# Patient Record
Sex: Female | Born: 1965 | Race: White | Hispanic: No | Smoking: Former smoker
Health system: Southern US, Community
[De-identification: ages and names within clinical notes are randomized; demographics above are authoritative.]

## PROBLEM LIST (undated history)

## (undated) DIAGNOSIS — I493 Ventricular premature depolarization: Secondary | ICD-10-CM

## (undated) DIAGNOSIS — O2230 Deep phlebothrombosis in pregnancy, unspecified trimester: Secondary | ICD-10-CM

## (undated) DIAGNOSIS — E782 Mixed hyperlipidemia: Secondary | ICD-10-CM

## (undated) DIAGNOSIS — E119 Type 2 diabetes mellitus without complications: Secondary | ICD-10-CM

## (undated) DIAGNOSIS — E559 Vitamin D deficiency, unspecified: Secondary | ICD-10-CM

## (undated) DIAGNOSIS — B369 Superficial mycosis, unspecified: Secondary | ICD-10-CM

## (undated) DIAGNOSIS — G43909 Migraine, unspecified, not intractable, without status migrainosus: Secondary | ICD-10-CM

## (undated) DIAGNOSIS — G4730 Sleep apnea, unspecified: Secondary | ICD-10-CM

## (undated) DIAGNOSIS — R0989 Other specified symptoms and signs involving the circulatory and respiratory systems: Secondary | ICD-10-CM

## (undated) DIAGNOSIS — E039 Hypothyroidism, unspecified: Secondary | ICD-10-CM

## (undated) DIAGNOSIS — F329 Major depressive disorder, single episode, unspecified: Secondary | ICD-10-CM

## (undated) DIAGNOSIS — I10 Essential (primary) hypertension: Secondary | ICD-10-CM

## (undated) DIAGNOSIS — M1990 Unspecified osteoarthritis, unspecified site: Secondary | ICD-10-CM

## (undated) DIAGNOSIS — M3320 Polymyositis, organ involvement unspecified: Secondary | ICD-10-CM

## (undated) DIAGNOSIS — G7249 Other inflammatory and immune myopathies, not elsewhere classified: Secondary | ICD-10-CM

## (undated) DIAGNOSIS — M48061 Spinal stenosis, lumbar region without neurogenic claudication: Secondary | ICD-10-CM

## (undated) DIAGNOSIS — G459 Transient cerebral ischemic attack, unspecified: Secondary | ICD-10-CM

## (undated) HISTORY — PX: BREAST REDUCTION SURGERY: SHX8

## (undated) HISTORY — PX: CHOLECYSTECTOMY: SHX55

## (undated) HISTORY — DX: Unspecified osteoarthritis, unspecified site: M19.90

## (undated) HISTORY — DX: Sleep apnea, unspecified: G47.30

## (undated) HISTORY — PX: BILATERAL OOPHORECTOMY: SHX1221

## (undated) HISTORY — DX: Migraine, unspecified, not intractable, without status migrainosus: G43.909

## (undated) HISTORY — DX: Polymyositis, organ involvement unspecified: M33.20

## (undated) HISTORY — DX: Other inflammatory and immune myopathies, not elsewhere classified: G72.49

## (undated) HISTORY — PX: ABDOMINAL HYSTERECTOMY: SHX81

## (undated) HISTORY — DX: Superficial mycosis, unspecified: B36.9

## (undated) HISTORY — DX: Other specified symptoms and signs involving the circulatory and respiratory systems: R09.89

## (undated) HISTORY — PX: TONSILLECTOMY: SUR1361

## (undated) HISTORY — PX: LEFT HEART CATH: CATH118248

## (undated) HISTORY — DX: Transient cerebral ischemic attack, unspecified: G45.9

## (undated) HISTORY — DX: Mixed hyperlipidemia: E78.2

## (undated) HISTORY — DX: Ventricular premature depolarization: I49.3

## (undated) HISTORY — DX: Hypothyroidism, unspecified: E03.9

## (undated) HISTORY — DX: Vitamin D deficiency, unspecified: E55.9

## (undated) HISTORY — DX: Major depressive disorder, single episode, unspecified: F32.9

## (undated) HISTORY — DX: Deep phlebothrombosis in pregnancy, unspecified trimester: O22.30

## (undated) HISTORY — DX: Type 2 diabetes mellitus without complications: E11.9

---

## 2011-03-25 HISTORY — PX: GASTRECTOMY: SHX58

## 2011-06-24 ENCOUNTER — Other Ambulatory Visit: Payer: Self-pay | Admitting: *Deleted

## 2011-06-24 ENCOUNTER — Other Ambulatory Visit: Payer: Self-pay | Admitting: Internal Medicine

## 2011-06-24 DIAGNOSIS — N63 Unspecified lump in unspecified breast: Secondary | ICD-10-CM

## 2011-06-27 ENCOUNTER — Ambulatory Visit
Admission: RE | Admit: 2011-06-27 | Discharge: 2011-06-27 | Disposition: A | Discharge status: Home / Self Care | Payer: BC Managed Care – PPO | Source: Ambulatory Visit | Attending: *Deleted | Admitting: *Deleted

## 2011-06-27 DIAGNOSIS — N63 Unspecified lump in unspecified breast: Secondary | ICD-10-CM

## 2011-07-01 ENCOUNTER — Other Ambulatory Visit: Payer: Self-pay | Admitting: *Deleted

## 2011-07-01 ENCOUNTER — Other Ambulatory Visit: Payer: Self-pay | Admitting: Licensed Clinical Social Worker

## 2011-07-01 DIAGNOSIS — N63 Unspecified lump in unspecified breast: Secondary | ICD-10-CM

## 2011-07-02 ENCOUNTER — Ambulatory Visit
Admission: RE | Admit: 2011-07-02 | Discharge: 2011-07-02 | Disposition: A | Discharge status: Home / Self Care | Payer: BC Managed Care – PPO | Source: Ambulatory Visit | Attending: *Deleted | Admitting: *Deleted

## 2011-07-02 DIAGNOSIS — N63 Unspecified lump in unspecified breast: Secondary | ICD-10-CM

## 2011-12-29 ENCOUNTER — Other Ambulatory Visit: Payer: Self-pay | Admitting: Internal Medicine

## 2011-12-29 DIAGNOSIS — Z1231 Encounter for screening mammogram for malignant neoplasm of breast: Secondary | ICD-10-CM

## 2012-01-20 ENCOUNTER — Ambulatory Visit
Admission: RE | Admit: 2012-01-20 | Discharge: 2012-01-20 | Disposition: A | Discharge status: Home / Self Care | Payer: BC Managed Care – PPO | Source: Ambulatory Visit | Attending: Internal Medicine | Admitting: Internal Medicine

## 2012-01-20 DIAGNOSIS — Z1231 Encounter for screening mammogram for malignant neoplasm of breast: Secondary | ICD-10-CM

## 2012-04-02 ENCOUNTER — Encounter (HOSPITAL_COMMUNITY): Admission: RE | Payer: Self-pay | Source: Ambulatory Visit

## 2012-04-02 ENCOUNTER — Ambulatory Visit (HOSPITAL_COMMUNITY)
Admission: RE | Admit: 2012-04-02 | Payer: BC Managed Care – PPO | Source: Ambulatory Visit | Admitting: Cardiovascular Disease

## 2012-04-02 SURGERY — ECHOCARDIOGRAM, TRANSESOPHAGEAL
Anesthesia: Moderate Sedation

## 2013-03-15 ENCOUNTER — Other Ambulatory Visit: Payer: Self-pay

## 2013-03-15 DIAGNOSIS — Z1231 Encounter for screening mammogram for malignant neoplasm of breast: Secondary | ICD-10-CM

## 2013-03-25 ENCOUNTER — Ambulatory Visit
Admission: RE | Admit: 2013-03-25 | Discharge: 2013-03-25 | Disposition: A | Discharge status: Home / Self Care | Payer: 59 | Source: Ambulatory Visit

## 2013-03-25 DIAGNOSIS — Z1231 Encounter for screening mammogram for malignant neoplasm of breast: Secondary | ICD-10-CM

## 2014-01-17 ENCOUNTER — Other Ambulatory Visit: Payer: Self-pay | Admitting: Internal Medicine

## 2014-01-17 DIAGNOSIS — N6310 Unspecified lump in the right breast, unspecified quadrant: Secondary | ICD-10-CM

## 2014-01-26 ENCOUNTER — Other Ambulatory Visit: Payer: 59

## 2014-01-27 ENCOUNTER — Other Ambulatory Visit: Payer: 59

## 2016-02-01 ENCOUNTER — Other Ambulatory Visit: Payer: Self-pay | Admitting: Internal Medicine

## 2016-02-04 ENCOUNTER — Other Ambulatory Visit: Payer: Self-pay | Admitting: Internal Medicine

## 2016-02-04 DIAGNOSIS — G459 Transient cerebral ischemic attack, unspecified: Secondary | ICD-10-CM

## 2016-02-06 ENCOUNTER — Other Ambulatory Visit: Payer: Self-pay | Admitting: Internal Medicine

## 2016-02-06 DIAGNOSIS — J849 Interstitial pulmonary disease, unspecified: Secondary | ICD-10-CM

## 2016-02-07 ENCOUNTER — Ambulatory Visit
Admission: RE | Admit: 2016-02-07 | Discharge: 2016-02-07 | Disposition: A | Discharge status: Home / Self Care | Payer: BLUE CROSS/BLUE SHIELD | Source: Ambulatory Visit | Attending: Internal Medicine | Admitting: Internal Medicine

## 2016-02-07 DIAGNOSIS — J849 Interstitial pulmonary disease, unspecified: Secondary | ICD-10-CM

## 2016-02-13 ENCOUNTER — Other Ambulatory Visit (HOSPITAL_COMMUNITY): Payer: Self-pay | Admitting: Internal Medicine

## 2016-02-13 DIAGNOSIS — R162 Hepatomegaly with splenomegaly, not elsewhere classified: Secondary | ICD-10-CM

## 2016-10-30 ENCOUNTER — Other Ambulatory Visit: Payer: Self-pay | Admitting: Internal Medicine

## 2016-10-30 DIAGNOSIS — R1084 Generalized abdominal pain: Secondary | ICD-10-CM

## 2016-11-04 ENCOUNTER — Ambulatory Visit
Admission: RE | Admit: 2016-11-04 | Discharge: 2016-11-04 | Disposition: A | Discharge status: Home / Self Care | Payer: BLUE CROSS/BLUE SHIELD | Source: Ambulatory Visit | Attending: Internal Medicine | Admitting: Internal Medicine

## 2016-11-04 DIAGNOSIS — R1084 Generalized abdominal pain: Secondary | ICD-10-CM

## 2016-11-04 MED ORDER — IOPAMIDOL (ISOVUE-300) INJECTION 61%
125.0000 mL | Freq: Once | INTRAVENOUS | Status: AC | PRN
  Administered 2016-11-04: 125 mL via INTRAVENOUS

## 2017-02-02 ENCOUNTER — Encounter: Payer: Self-pay | Admitting: Cardiology

## 2017-04-12 ENCOUNTER — Encounter: Payer: Self-pay | Admitting: Cardiology

## 2017-05-04 ENCOUNTER — Other Ambulatory Visit: Payer: Self-pay | Admitting: Internal Medicine

## 2017-05-04 DIAGNOSIS — J849 Interstitial pulmonary disease, unspecified: Secondary | ICD-10-CM

## 2017-05-13 ENCOUNTER — Ambulatory Visit
Admission: RE | Admit: 2017-05-13 | Discharge: 2017-05-13 | Disposition: A | Discharge status: Home / Self Care | Payer: BLUE CROSS/BLUE SHIELD | Source: Ambulatory Visit | Attending: Internal Medicine | Admitting: Internal Medicine

## 2017-05-13 DIAGNOSIS — J849 Interstitial pulmonary disease, unspecified: Secondary | ICD-10-CM

## 2017-05-18 ENCOUNTER — Other Ambulatory Visit: Payer: BLUE CROSS/BLUE SHIELD

## 2017-08-20 ENCOUNTER — Other Ambulatory Visit: Payer: Self-pay | Admitting: Internal Medicine

## 2017-08-20 DIAGNOSIS — S32810S Multiple fractures of pelvis with stable disruption of pelvic ring, sequela: Secondary | ICD-10-CM

## 2017-08-28 ENCOUNTER — Ambulatory Visit
Admission: RE | Admit: 2017-08-28 | Discharge: 2017-08-28 | Disposition: A | Discharge status: Home / Self Care | Payer: Managed Care, Other (non HMO) | Source: Ambulatory Visit | Attending: Internal Medicine | Admitting: Internal Medicine

## 2017-08-28 DIAGNOSIS — S32810S Multiple fractures of pelvis with stable disruption of pelvic ring, sequela: Secondary | ICD-10-CM

## 2017-10-01 ENCOUNTER — Encounter: Payer: Self-pay | Admitting: Cardiology

## 2018-04-04 ENCOUNTER — Encounter: Payer: Self-pay | Admitting: Cardiology

## 2018-04-26 ENCOUNTER — Encounter: Payer: Self-pay | Admitting: Cardiology

## 2018-04-26 NOTE — Progress Notes (Signed)
Cardiology Office Note   Date:  04/28/2018   ID:  Elizabeth Higgins, DOB 12-21-1965, MRN [id number]  PCP:  Doreen Salvage, PA-C  Cardiologist:   No primary care provider on file. Referring:  Coralee Rud, PA-C   Chief Complaint  Patient presents with  . Shortness of Breath  . Chest Pain     History of Present Illness: Elizabeth Higgins is a 53 y.o. female who is referred by Coralee Rud, PA-C  for evaluation of chest pain.  She was evaluated at [facility] for this.   She has a somewhat complicated past medical history with myositis managed at [facility].  This is left her with significant weakness.  She gets around with a walker.  She did have an abnormal stress test with chest pain in 2012.  She reports a cardiac catheterization that I do not have but she recalls having a 30% right coronary artery lesion.  She was having chest pain last year.  She had a stress perfusion study which did not suggest ischemia.  However, she had chest discomfort.  She had chest discomfort in January that was very severe.  She describes a midsternal discomfort.  There is radiation to her jaw or to her arms.  Going through to her back.  She had diaphoresis.  She had nausea.  She did have sublingual nitroglycerin and did not have improvement.  She went to [facility] where she was hospitalized overnight.  She ruled out for myocardial infarction.  However, because of her risk factors and symptoms the plan was for cardiac CT.  Unfortunately they could not get her heart rate down despite very high doses of beta-blockers.  She is referred now for consideration of cardiac catheterization by her cardiology practice.  She prefers to have this done at our institution rather than [place] will wait for his hospital.  She continues to have chest discomfort intermittently.  Typically is going away with one nitroglycerin.  She is on Imdur.  She is also had some tachypalpitations.  She is recorded heart rates in the 160s on her pulse  ox.  She is not describing presyncope or syncope however.     Past Medical History:  Diagnosis Date  . Autoimmune myopathy   . Bruit   . Dermatomycosis   . Diabetes mellitus type 2, controlled (HCC)   . DVT (deep vein thrombosis) in pregnancy   . Hyperlipemia, mixed   . Hypothyroidism   . Intermittent cerebral ischemia   . Migraines    WITH AURA  . Osteoarthritis   . Polymyositis (HCC)   . PVC (premature ventricular contraction)   . Reactive depression (situational)   . Sleep apnea    CPAP with 02  . Vitamin D deficiency     Past Surgical History:  Procedure Laterality Date  . ABDOMINAL HYSTERECTOMY    . BILATERAL OOPHORECTOMY    . BREAST REDUCTION SURGERY Bilateral   . CESAREAN SECTION    . CHOLECYSTECTOMY    . GASTRECTOMY  2013   SLEEVE  . LEFT HEART CATH    . TONSILLECTOMY       Current Outpatient Medications  Medication Sig Dispense Refill  . albuterol (PROVENTIL HFA;VENTOLIN HFA) 108 (90 Base) MCG/ACT inhaler Inhale 2 puffs into the lungs every 4 (four) hours as needed for wheezing or shortness of breath.    Marland Kitchen albuterol (PROVENTIL) (2.5 MG/3ML) 0.083% nebulizer solution Take 2.5 mg by nebulization every 6 (six) hours as needed for wheezing  or shortness of breath.    . allopurinol (ZYLOPRIM) 100 MG tablet Take 100 mg by mouth daily.    . baclofen (LIORESAL) 10 MG tablet Take 10 mg by mouth 4 (four) times daily as needed for muscle spasms.    . Cetirizine HCl (ZYRTEC PO) Take by mouth daily.    . clopidogrel (PLAVIX) 75 MG tablet Take 75 mg by mouth daily.    Marland Kitchen diltiazem (TIAZAC) 240 MG 24 hr capsule Take 240 mg by mouth daily.    Marland Kitchen dronabinol (MARINOL) 2.5 MG capsule Take 2.5 mg by mouth 2 (two) times daily before a meal.    . ergocalciferol (VITAMIN D2) 1.25 MG (50000 UT) capsule Take 50,000 Units by mouth 2 (two) times a week.    . fluconazole (DIFLUCAN) 200 MG tablet Take 200 mg by mouth daily.    . Fluticasone-Umeclidin-Vilant (TRELEGY ELLIPTA) 100-62.5-25  MCG/INH AEPB Inhale into the lungs.    . gabapentin (NEURONTIN) 300 MG capsule Take 300 mg by mouth 4 (four) times daily.    . hydrocortisone 2.5 % cream Apply 1 application topically 2 (two) times daily.    . insulin aspart (NOVOLOG) 100 UNIT/ML injection Inject into the skin 3 (three) times daily before meals.    . isosorbide mononitrate (IMDUR) 30 MG 24 hr tablet Take 30 mg by mouth daily.    Marland Kitchen levothyroxine (SYNTHROID, LEVOTHROID) 125 MCG tablet Take 125 mcg by mouth daily before breakfast.    . LORazepam (ATIVAN) 1 MG tablet Take 1 mg by mouth every 4 (four) hours as needed for anxiety.    Marland Kitchen losartan (COZAAR) 50 MG tablet Take 50 mg by mouth daily.    . magnesium gluconate (MAGONATE) 500 MG tablet Take 500 mg by mouth daily.    . metoCLOPramide (REGLAN) 10 MG tablet Take 10 mg by mouth 2 (two) times daily.    . metolazone (ZAROXOLYN) 5 MG tablet Take 5 mg by mouth daily.    . montelukast (SINGULAIR) 10 MG tablet Take 10 mg by mouth at bedtime.    . mycophenolate (CELLCEPT) 500 MG tablet Take 500 mg by mouth 2 (two) times daily.    . nitroGLYCERIN (NITROSTAT) 0.4 MG SL tablet Place 0.4 mg under the tongue every 5 (five) minutes as needed for chest pain.    Marland Kitchen nystatin (MYCOSTATIN) 100000 UNIT/ML suspension Take 5 mLs by mouth 4 (four) times daily.    Marland Kitchen nystatin (NYSTATIN) powder Apply topically 2 (two) times daily.    . ondansetron (ZOFRAN) 8 MG tablet Take 8 mg by mouth 2 (two) times daily.    Marland Kitchen oseltamivir (TAMIFLU) 75 MG capsule Take 75 mg by mouth daily.    Marland Kitchen oxyCODONE (OXY IR/ROXICODONE) 5 MG immediate release tablet Take 5 mg by mouth every 4 (four) hours as needed for severe pain.    . pantoprazole (PROTONIX) 40 MG tablet Take 40 mg by mouth daily.    . Pitavastatin Calcium (LIVALO) 2 MG TABS Take 1 tablet by mouth 2 (two) times a week.    . potassium chloride (K-DUR,KLOR-CON) 10 MEQ tablet Take 10 mEq by mouth 5 (five) times daily.    . predniSONE (DELTASONE) 10 MG tablet Take 10 mg  by mouth daily with breakfast.    . promethazine (PHENERGAN) 12.5 MG tablet Take 12.5 mg by mouth every 6 (six) hours as needed for nausea or vomiting.    . sucralfate (CARAFATE) 1 g tablet Take 1 g by mouth 4 (four) times daily -  with meals and at bedtime.     No current facility-administered medications for this visit.     Allergies:   Cymbalta [duloxetine hcl]; Demerol [meperidine hcl]; Penicillins; and Rocephin [ceftriaxone]    Social History:  The patient  reports that she quit smoking about 11 years ago. She has never used smokeless tobacco. She reports that she does not drink alcohol or use drugs.   Family History:  The patient's family history includes Diabetes Mellitus II in her mother and sister; Heart disease in her father and mother; Hyperlipidemia in her father and mother; Hypertension in her father and mother.    ROS:  Please see the history of present illness.   Otherwise, review of systems are positive for joint pain, diffuse weakness.   All other systems are reviewed and negative.    PHYSICAL EXAM: VS:  BP 128/84 (BP Location: Left Wrist, Patient Position: Sitting, Cuff Size: Normal)   Pulse 92   Ht 5\' 3"  (1.6 m)   Wt 285 lb (129.3 kg)   BMI 50.49 kg/m  , BMI Body mass index is 50.49 kg/m. GENERAL:  Well appearing HEENT:  Pupils equal round and reactive, fundi not visualized, oral mucosa unremarkable NECK:  No jugular venous distention, waveform within normal limits, carotid upstroke brisk and symmetric, no bruits, no thyromegaly LYMPHATICS:  No cervical, inguinal adenopathy LUNGS:  Clear to auscultation bilaterally BACK:  No CVA tenderness CHEST:  Unremarkable HEART:  PMI not displaced or sustained,S1 and S2 within normal limits, no S3, no S4, no clicks, no rubs, no murmurs ABD:  Flat, positive bowel sounds normal in frequency in pitch, no bruits, no rebound, no guarding, no midline pulsatile mass, no hepatomegaly, no splenomegaly EXT:  2 plus pulses throughout,  no edema, no cyanosis no clubbing SKIN:  No rashes no nodules NEURO:  Cranial nerves II through XII grossly intact, motor grossly intact throughout PSYCH:  Cognitively intact, oriented to person place and time    EKG:  EKG is ordered today. The ekg ordered today demonstrates sinus rhythm, rate 92, axis within normal limits, intervals within normal limits, possible left atrial enlargement, premature atrial contraction, nonspecific T wave flattening, low voltage in limb and chest leads.   Recent Labs: No results found for requested labs within last 8760 hours.    Lipid Panel No results found for: CHOL, TRIG, HDL, CHOLHDL, VLDL, LDLCALC, LDLDIRECT    Wt Readings from Last 3 Encounters:  04/27/18 285 lb (129.3 kg)      Other studies Reviewed: Additional studies/ records that were reviewed today include: Extensive office records. Review of the above records demonstrates:  Please see elsewhere in the note.     ASSESSMENT AND PLAN:   CHEST PAIN:   The patient's chest pain is consistent with unstable angina.  She was unable to have a cardiac CT.  Cardiac catheterization is indicated. The patient understands that risks included but are not limited to stroke (1 in 1000), death (1 in 1000), kidney failure [usually temporary] (1 in 500), bleeding (1 in 200), allergic reaction [possibly serious] (1 in 200).  The patient understands and agrees to proceed.   TACHYCARDIA: For now she will continue the meds as listed.  Labs have been drawn to include a TSH.  I will defer change in therapy at this point.  DYSPNEA: Right heart catheterization has been requested as well.  We will obtain this.   Current medicines are reviewed at length with the patient today.  The patient does not  have concerns regarding medicines.  The following changes have been made:  no change  Labs/ tests ordered today include:   Orders Placed This Encounter  Procedures  . EKG 12-Lead     Disposition:   FU with  Arnette Felts, PAc    Signed, Rollene Rotunda, MD  04/28/2018 9:51 AM    [facility]

## 2018-04-26 NOTE — H&P (View-Only) (Signed)
  Cardiology Office Note   Date:  04/28/2018   ID:  Elizabeth Higgins, DOB 10/07/1965, MRN [id number]  PCP:  Bulla, Donald, PA-C  Cardiologist:   No primary care provider on file. Referring:  Duran, Michael R, PA-C   Chief Complaint  Patient presents with  . Shortness of Breath  . Chest Pain     History of Present Illness: Elizabeth Higgins is a 53 y.o. female who is referred by Duran, Michael R, PA-C  for evaluation of chest pain.  She was evaluated at [facility] for this.   She has a somewhat complicated past medical history with myositis managed at [facility].  This is left her with significant weakness.  She gets around with a walker.  She did have an abnormal stress test with chest pain in 2012.  She reports a cardiac catheterization that I do not have but she recalls having a 30% right coronary artery lesion.  She was having chest pain last year.  She had a stress perfusion study which did not suggest ischemia.  However, she had chest discomfort.  She had chest discomfort in January that was very severe.  She describes a midsternal discomfort.  There is radiation to her jaw or to her arms.  Going through to her back.  She had diaphoresis.  She had nausea.  She did have sublingual nitroglycerin and did not have improvement.  She went to [place] where she was hospitalized overnight.  She ruled out for myocardial infarction.  However, because of her risk factors and symptoms the plan was for cardiac CT.  Unfortunately they could not get her heart rate down despite very high doses of beta-blockers.  She is referred now for consideration of cardiac catheterization by her cardiology practice.  She prefers to have this done at our institution rather than [place] will wait for his hospital.  She continues to have chest discomfort intermittently.  Typically is going away with one nitroglycerin.  She is on Imdur.  She is also had some tachypalpitations.  She is recorded heart rates in the 160s on her pulse  ox.  She is not describing presyncope or syncope however.     Past Medical History:  Diagnosis Date  . Autoimmune myopathy   . Bruit   . Dermatomycosis   . Diabetes mellitus type 2, controlled (HCC)   . DVT (deep vein thrombosis) in pregnancy   . Hyperlipemia, mixed   . Hypothyroidism   . Intermittent cerebral ischemia   . Migraines    WITH AURA  . Osteoarthritis   . Polymyositis (HCC)   . PVC (premature ventricular contraction)   . Reactive depression (situational)   . Sleep apnea    CPAP with 02  . Vitamin D deficiency     Past Surgical History:  Procedure Laterality Date  . ABDOMINAL HYSTERECTOMY    . BILATERAL OOPHORECTOMY    . BREAST REDUCTION SURGERY Bilateral   . CESAREAN SECTION    . CHOLECYSTECTOMY    . GASTRECTOMY  2013   SLEEVE  . LEFT HEART CATH    . TONSILLECTOMY       Current Outpatient Medications  Medication Sig Dispense Refill  . albuterol (PROVENTIL HFA;VENTOLIN HFA) 108 (90 Base) MCG/ACT inhaler Inhale 2 puffs into the lungs every 4 (four) hours as needed for wheezing or shortness of breath.    . albuterol (PROVENTIL) (2.5 MG/3ML) 0.083% nebulizer solution Take 2.5 mg by nebulization every 6 (six) hours as needed for wheezing   or shortness of breath.    . allopurinol (ZYLOPRIM) 100 MG tablet Take 100 mg by mouth daily.    . baclofen (LIORESAL) 10 MG tablet Take 10 mg by mouth 4 (four) times daily as needed for muscle spasms.    . Cetirizine HCl (ZYRTEC PO) Take by mouth daily.    . clopidogrel (PLAVIX) 75 MG tablet Take 75 mg by mouth daily.    . diltiazem (TIAZAC) 240 MG 24 hr capsule Take 240 mg by mouth daily.    . dronabinol (MARINOL) 2.5 MG capsule Take 2.5 mg by mouth 2 (two) times daily before a meal.    . ergocalciferol (VITAMIN D2) 1.25 MG (50000 UT) capsule Take 50,000 Units by mouth 2 (two) times a week.    . fluconazole (DIFLUCAN) 200 MG tablet Take 200 mg by mouth daily.    . Fluticasone-Umeclidin-Vilant (TRELEGY ELLIPTA) 100-62.5-25  MCG/INH AEPB Inhale into the lungs.    . gabapentin (NEURONTIN) 300 MG capsule Take 300 mg by mouth 4 (four) times daily.    . hydrocortisone 2.5 % cream Apply 1 application topically 2 (two) times daily.    . insulin aspart (NOVOLOG) 100 UNIT/ML injection Inject into the skin 3 (three) times daily before meals.    . isosorbide mononitrate (IMDUR) 30 MG 24 hr tablet Take 30 mg by mouth daily.    . levothyroxine (SYNTHROID, LEVOTHROID) 125 MCG tablet Take 125 mcg by mouth daily before breakfast.    . LORazepam (ATIVAN) 1 MG tablet Take 1 mg by mouth every 4 (four) hours as needed for anxiety.    . losartan (COZAAR) 50 MG tablet Take 50 mg by mouth daily.    . magnesium gluconate (MAGONATE) 500 MG tablet Take 500 mg by mouth daily.    . metoCLOPramide (REGLAN) 10 MG tablet Take 10 mg by mouth 2 (two) times daily.    . metolazone (ZAROXOLYN) 5 MG tablet Take 5 mg by mouth daily.    . montelukast (SINGULAIR) 10 MG tablet Take 10 mg by mouth at bedtime.    . mycophenolate (CELLCEPT) 500 MG tablet Take 500 mg by mouth 2 (two) times daily.    . nitroGLYCERIN (NITROSTAT) 0.4 MG SL tablet Place 0.4 mg under the tongue every 5 (five) minutes as needed for chest pain.    . nystatin (MYCOSTATIN) 100000 UNIT/ML suspension Take 5 mLs by mouth 4 (four) times daily.    . nystatin (NYSTATIN) powder Apply topically 2 (two) times daily.    . ondansetron (ZOFRAN) 8 MG tablet Take 8 mg by mouth 2 (two) times daily.    . oseltamivir (TAMIFLU) 75 MG capsule Take 75 mg by mouth daily.    . oxyCODONE (OXY IR/ROXICODONE) 5 MG immediate release tablet Take 5 mg by mouth every 4 (four) hours as needed for severe pain.    . pantoprazole (PROTONIX) 40 MG tablet Take 40 mg by mouth daily.    . Pitavastatin Calcium (LIVALO) 2 MG TABS Take 1 tablet by mouth 2 (two) times a week.    . potassium chloride (K-DUR,KLOR-CON) 10 MEQ tablet Take 10 mEq by mouth 5 (five) times daily.    . predniSONE (DELTASONE) 10 MG tablet Take 10 mg  by mouth daily with breakfast.    . promethazine (PHENERGAN) 12.5 MG tablet Take 12.5 mg by mouth every 6 (six) hours as needed for nausea or vomiting.    . sucralfate (CARAFATE) 1 g tablet Take 1 g by mouth 4 (four) times daily -    with meals and at bedtime.     No current facility-administered medications for this visit.     Allergies:   Cymbalta [duloxetine hcl]; Demerol [meperidine hcl]; Penicillins; and Rocephin [ceftriaxone]    Social History:  The patient  reports that she quit smoking about 11 years ago. She has never used smokeless tobacco. She reports that she does not drink alcohol or use drugs.   Family History:  The patient's family history includes Diabetes Mellitus II in her mother and sister; Heart disease in her father and mother; Hyperlipidemia in her father and mother; Hypertension in her father and mother.    ROS:  Please see the history of present illness.   Otherwise, review of systems are positive for joint pain, diffuse weakness.   All other systems are reviewed and negative.    PHYSICAL EXAM: VS:  BP 128/84 (BP Location: Left Wrist, Patient Position: Sitting, Cuff Size: Normal)   Pulse 92   Ht 5' 3" (1.6 m)   Wt 285 lb (129.3 kg)   BMI 50.49 kg/m  , BMI Body mass index is 50.49 kg/m. GENERAL:  Well appearing HEENT:  Pupils equal round and reactive, fundi not visualized, oral mucosa unremarkable NECK:  No jugular venous distention, waveform within normal limits, carotid upstroke brisk and symmetric, no bruits, no thyromegaly LYMPHATICS:  No cervical, inguinal adenopathy LUNGS:  Clear to auscultation bilaterally BACK:  No CVA tenderness CHEST:  Unremarkable HEART:  PMI not displaced or sustained,S1 and S2 within normal limits, no S3, no S4, no clicks, no rubs, no murmurs ABD:  Flat, positive bowel sounds normal in frequency in pitch, no bruits, no rebound, no guarding, no midline pulsatile mass, no hepatomegaly, no splenomegaly EXT:  2 plus pulses throughout,  no edema, no cyanosis no clubbing SKIN:  No rashes no nodules NEURO:  Cranial nerves II through XII grossly intact, motor grossly intact throughout PSYCH:  Cognitively intact, oriented to person place and time    EKG:  EKG is ordered today. The ekg ordered today demonstrates sinus rhythm, rate 92, axis within normal limits, intervals within normal limits, possible left atrial enlargement, premature atrial contraction, nonspecific T wave flattening, low voltage in limb and chest leads.   Recent Labs: No results found for requested labs within last 8760 hours.    Lipid Panel No results found for: CHOL, TRIG, HDL, CHOLHDL, VLDL, LDLCALC, LDLDIRECT    Wt Readings from Last 3 Encounters:  04/27/18 285 lb (129.3 kg)      Other studies Reviewed: Additional studies/ records that were reviewed today include: Extensive office records. Review of the above records demonstrates:  Please see elsewhere in the note.     ASSESSMENT AND PLAN:   CHEST PAIN:   The patient's chest pain is consistent with unstable angina.  She was unable to have a cardiac CT.  Cardiac catheterization is indicated. The patient understands that risks included but are not limited to stroke (1 in 1000), death (1 in 1000), kidney failure [usually temporary] (1 in 500), bleeding (1 in 200), allergic reaction [possibly serious] (1 in 200).  The patient understands and agrees to proceed.   TACHYCARDIA: For now she will continue the meds as listed.  Labs have been drawn to include a TSH.  I will defer change in therapy at this point.  DYSPNEA: Right heart catheterization has been requested as well.  We will obtain this.   Current medicines are reviewed at length with the patient today.  The patient does not   have concerns regarding medicines.  The following changes have been made:  no change  Labs/ tests ordered today include:   Orders Placed This Encounter  Procedures  . EKG 12-Lead     Disposition:   FU with  Mike Duran, PAc    Signed, Jeimy Bickert, MD  04/28/2018 9:51 AM    [facility]    

## 2018-04-27 ENCOUNTER — Encounter: Payer: Self-pay | Admitting: Cardiology

## 2018-04-27 ENCOUNTER — Encounter (INDEPENDENT_AMBULATORY_CARE_PROVIDER_SITE_OTHER): Payer: Self-pay

## 2018-04-27 ENCOUNTER — Ambulatory Visit: Payer: BLUE CROSS/BLUE SHIELD | Admitting: Cardiology

## 2018-04-27 VITALS — BP 128/84 | HR 92 | Ht 63.0 in | Wt 285.0 lb

## 2018-04-27 DIAGNOSIS — R0602 Shortness of breath: Secondary | ICD-10-CM | POA: Diagnosis not present

## 2018-04-27 DIAGNOSIS — R072 Precordial pain: Secondary | ICD-10-CM | POA: Diagnosis not present

## 2018-04-27 DIAGNOSIS — R000 Tachycardia, unspecified: Secondary | ICD-10-CM

## 2018-04-27 NOTE — Patient Instructions (Addendum)
Medication Instructions:  Continue current medications  If you need a refill on your cardiac medications before your next appointment, please call your pharmacy.  Labwork: None Ordered  Take the provided lab slips with you to the lab for your blood draw.   When you have your labs (blood work) drawn today and your tests are completely normal, you will receive your results only by MyChart Message (if you have MyChart) -OR-  A paper copy in the mail.  If you have any lab test that is abnormal or we need to change your treatment, we will call you to review these results.  Testing/Procedures: Your physician has requested that you have a cardiac catheterization. Cardiac catheterization is used to diagnose and/or treat various heart conditions. Doctors may recommend this procedure for a number of different reasons. The most common reason is to evaluate chest pain. Chest pain can be a symptom of coronary artery disease (CAD), and cardiac catheterization can show whether plaque is narrowing or blocking your heart's arteries. This procedure is also used to evaluate the valves, as well as measure the blood flow and oxygen levels in different parts of your heart. For further information please visit [email or web address] Please follow instruction sheet, as given.  Special Instructions:    [facility] [address] [place] [address] Dept: [phone] Loc: [phone]  Elizabeth CoopJudith Higgins  04/27/2018  You are scheduled for a Cardiac Catheterization on Friday, February 7 with Dr. Bryan Lemmaavid Harding.  1. [facility] at [facility]: [address] [place], [place] [address] at 10:00 AM (This time is two hours before your procedure to ensure your preparation). Free valet parking service is available.   Special note: Every effort is made to have your procedure done on time. Please  understand that emergencies sometimes delay scheduled procedures.  2. Diet: Do not eat solid foods after midnight.  The patient may have clear liquids until 5am upon the day of the procedure.  3. Labs: None  4. Medication instructions in preparation for your procedure:  Stop taking, Cozaar (Losartan) Friday, February 6,  HOLD Novolog day of procedure   On the morning of your procedure, take your Plavix/Clopidogrel and any morning medicines NOT listed above.  You may use sips of water.  5. Plan for one night stay--bring personal belongings. 6. Bring a current list of your medications and current insurance cards. 7. You MUST have a responsible person to drive you home. 8. Someone MUST be with you the first 24 hours after you arrive home or your discharge will be delayed. 9. Please wear clothes that are easy to get on and off and wear slip-on shoes.  Thank you for allowing us to care for you!   -- [facility]   Follow-Up: . Your physician recommends that you schedule a follow-up appointment in: After Cath   At [facility], you and your health needs are our priority.  As part of our continuing mission to provide you with exceptional heart care, we have created designated Provider Care Teams.  These Care Teams include your primary Cardiologist (physician) and Advanced Practice Providers (APPs -  Physician Assistants and Nurse Practitioners) who all work together to provide you with the care you need, when you need it.  Thank you for choosing [facility] at [facility]!!

## 2018-04-28 ENCOUNTER — Telehealth: Payer: Self-pay | Admitting: Cardiology

## 2018-04-28 ENCOUNTER — Encounter: Payer: Self-pay | Admitting: Cardiology

## 2018-04-28 ENCOUNTER — Encounter (HOSPITAL_COMMUNITY): Payer: Self-pay | Admitting: Cardiology

## 2018-04-28 DIAGNOSIS — R072 Precordial pain: Secondary | ICD-10-CM | POA: Insufficient documentation

## 2018-04-28 DIAGNOSIS — R000 Tachycardia, unspecified: Secondary | ICD-10-CM | POA: Insufficient documentation

## 2018-04-28 DIAGNOSIS — R0602 Shortness of breath: Secondary | ICD-10-CM | POA: Insufficient documentation

## 2018-04-28 DIAGNOSIS — I25110 Atherosclerotic heart disease of native coronary artery with unstable angina pectoris: Secondary | ICD-10-CM | POA: Diagnosis present

## 2018-04-28 NOTE — Telephone Encounter (Signed)
New Message    Patient wants to make sure the Labs results have been received from primary care physician's office.

## 2018-04-28 NOTE — Telephone Encounter (Signed)
Left message to call back  

## 2018-04-28 NOTE — Telephone Encounter (Signed)
Spoke with pt who was calling to inquire if Dr. Herbie Baltimore received her lab work from her pcp. Informed pt I will send a message to Nurse and have her call her back. Pt states to only call her back if results have not been received.

## 2018-04-29 ENCOUNTER — Telehealth: Payer: Self-pay | Admitting: Cardiology

## 2018-04-29 ENCOUNTER — Telehealth: Payer: Self-pay | Admitting: *Deleted

## 2018-04-29 NOTE — Telephone Encounter (Signed)
New message    Pt stated that she was told that her lab were not received. Pt is getting procedure done tomorrow at hospital and when called to review instructions she was told did not received labs. Please follow up with pt .

## 2018-04-29 NOTE — Telephone Encounter (Signed)
Pt contacted by our procedure nurse Emelia Loron. Her pre-cath lab results have been received.

## 2018-04-29 NOTE — Telephone Encounter (Signed)
Pt is aware that we have  February 3 lab results from [facility] and they are in [place].  Pt is aware that IV consult has been placed and she will been seen in Short Stay pre cath for peripheral IV access.

## 2018-04-29 NOTE — Telephone Encounter (Signed)
Pt contacted pre-catheterization scheduled at [facility] for: Friday April 30, 2018 12 noon Verified arrival time and place: [facility] Main Entrance A at: 10 AM  No solid food after midnight prior to cath, clear liquids until 5 AM day of procedure. Contrast allergy: no  Hold: Insulin-AM of procedure. Losartan-day before and day of procedure-GFR 54.83 Zaroxlyn-day before and day of procedure. KCl-pt will adjust. NSAIDS-day before and day of procedure.  Except hold medications AM meds can be  taken pre-cath with sip of water including: ASA 81 mg Plavix 75 mg   Confirmed patient has responsible person to drive home post procedure and observe 24 hours after arriving home: yes

## 2018-04-30 ENCOUNTER — Observation Stay (HOSPITAL_COMMUNITY)
Admission: RE | Admit: 2018-04-30 | Discharge: 2018-05-01 | Disposition: A | Discharge status: Home / Self Care | Payer: BLUE CROSS/BLUE SHIELD | Attending: Cardiology | Admitting: Cardiology

## 2018-04-30 ENCOUNTER — Other Ambulatory Visit: Payer: Self-pay

## 2018-04-30 ENCOUNTER — Encounter (HOSPITAL_COMMUNITY)
Admission: RE | Disposition: A | Discharge status: Home / Self Care | Payer: Self-pay | Source: Home / Self Care | Attending: Cardiology

## 2018-04-30 ENCOUNTER — Encounter (HOSPITAL_COMMUNITY): Payer: Self-pay | Admitting: *Deleted

## 2018-04-30 DIAGNOSIS — E876 Hypokalemia: Secondary | ICD-10-CM | POA: Diagnosis not present

## 2018-04-30 DIAGNOSIS — Z885 Allergy status to narcotic agent status: Secondary | ICD-10-CM | POA: Insufficient documentation

## 2018-04-30 DIAGNOSIS — M3320 Polymyositis, organ involvement unspecified: Secondary | ICD-10-CM | POA: Diagnosis not present

## 2018-04-30 DIAGNOSIS — R0602 Shortness of breath: Secondary | ICD-10-CM | POA: Diagnosis present

## 2018-04-30 DIAGNOSIS — Z79899 Other long term (current) drug therapy: Secondary | ICD-10-CM | POA: Diagnosis not present

## 2018-04-30 DIAGNOSIS — I25110 Atherosclerotic heart disease of native coronary artery with unstable angina pectoris: Secondary | ICD-10-CM | POA: Diagnosis not present

## 2018-04-30 DIAGNOSIS — R072 Precordial pain: Secondary | ICD-10-CM | POA: Diagnosis present

## 2018-04-30 DIAGNOSIS — I200 Unstable angina: Secondary | ICD-10-CM

## 2018-04-30 DIAGNOSIS — I472 Ventricular tachycardia: Secondary | ICD-10-CM | POA: Insufficient documentation

## 2018-04-30 DIAGNOSIS — E119 Type 2 diabetes mellitus without complications: Secondary | ICD-10-CM | POA: Insufficient documentation

## 2018-04-30 DIAGNOSIS — Z880 Allergy status to penicillin: Secondary | ICD-10-CM | POA: Insufficient documentation

## 2018-04-30 DIAGNOSIS — I493 Ventricular premature depolarization: Secondary | ICD-10-CM | POA: Diagnosis not present

## 2018-04-30 DIAGNOSIS — Z881 Allergy status to other antibiotic agents status: Secondary | ICD-10-CM | POA: Diagnosis not present

## 2018-04-30 DIAGNOSIS — Z888 Allergy status to other drugs, medicaments and biological substances status: Secondary | ICD-10-CM | POA: Insufficient documentation

## 2018-04-30 DIAGNOSIS — I5033 Acute on chronic diastolic (congestive) heart failure: Secondary | ICD-10-CM | POA: Diagnosis present

## 2018-04-30 DIAGNOSIS — E039 Hypothyroidism, unspecified: Secondary | ICD-10-CM | POA: Insufficient documentation

## 2018-04-30 DIAGNOSIS — I471 Supraventricular tachycardia: Secondary | ICD-10-CM | POA: Insufficient documentation

## 2018-04-30 DIAGNOSIS — R000 Tachycardia, unspecified: Secondary | ICD-10-CM | POA: Diagnosis present

## 2018-04-30 DIAGNOSIS — G4730 Sleep apnea, unspecified: Secondary | ICD-10-CM | POA: Insufficient documentation

## 2018-04-30 HISTORY — DX: Essential (primary) hypertension: I10

## 2018-04-30 HISTORY — DX: Spinal stenosis, lumbar region without neurogenic claudication: M48.061

## 2018-04-30 HISTORY — PX: RIGHT/LEFT HEART CATH AND CORONARY ANGIOGRAPHY: CATH118266

## 2018-04-30 LAB — POCT I-STAT EG7
Acid-Base Excess: 9 mmol/L — ABNORMAL HIGH (ref 0.0–2.0)
Acid-Base Excess: 9 mmol/L — ABNORMAL HIGH (ref 0.0–2.0)
Acid-Base Excess: 9 mmol/L — ABNORMAL HIGH (ref 0.0–2.0)
Bicarbonate: 34.4 mmol/L — ABNORMAL HIGH (ref 20.0–28.0)
Bicarbonate: 34 mmol/L — ABNORMAL HIGH (ref 20.0–28.0)
Bicarbonate: 34.3 mmol/L — ABNORMAL HIGH (ref 20.0–28.0)
CALCIUM ION: 1.1 mmol/L — AB (ref 1.15–1.40)
Calcium, Ion: 1.13 mmol/L — ABNORMAL LOW (ref 1.15–1.40)
Calcium, Ion: 1.1 mmol/L — ABNORMAL LOW (ref 1.15–1.40)
HCT: 34 % — ABNORMAL LOW (ref 36.0–46.0)
HCT: 32 % — ABNORMAL LOW (ref 36.0–46.0)
HCT: 33 % — ABNORMAL LOW (ref 36.0–46.0)
HEMOGLOBIN: 11.2 g/dL — AB (ref 12.0–15.0)
Hemoglobin: 11.6 g/dL — ABNORMAL LOW (ref 12.0–15.0)
Hemoglobin: 10.9 g/dL — ABNORMAL LOW (ref 12.0–15.0)
O2 Saturation: 69 %
O2 Saturation: 66 %
O2 Saturation: 75 %
PCO2 VEN: 50 mmHg (ref 44.0–60.0)
POTASSIUM: 3.3 mmol/L — AB (ref 3.5–5.1)
Potassium: 3.4 mmol/L — ABNORMAL LOW (ref 3.5–5.1)
Potassium: 3.3 mmol/L — ABNORMAL LOW (ref 3.5–5.1)
Sodium: 139 mmol/L (ref 135–145)
Sodium: 138 mmol/L (ref 135–145)
Sodium: 138 mmol/L (ref 135–145)
TCO2: 36 mmol/L — ABNORMAL HIGH (ref 22–32)
TCO2: 35 mmol/L — ABNORMAL HIGH (ref 22–32)
TCO2: 36 mmol/L — ABNORMAL HIGH (ref 22–32)
pCO2, Ven: 48.1 mmHg (ref 44.0–60.0)
pCO2, Ven: 47.7 mmHg (ref 44.0–60.0)
pH, Ven: 7.462 — ABNORMAL HIGH (ref 7.250–7.430)
pH, Ven: 7.444 — ABNORMAL HIGH (ref 7.250–7.430)
pH, Ven: 7.461 — ABNORMAL HIGH (ref 7.250–7.430)
pO2, Ven: 35 mmHg (ref 32.0–45.0)
pO2, Ven: 33 mmHg (ref 32.0–45.0)
pO2, Ven: 39 mmHg (ref 32.0–45.0)

## 2018-04-30 LAB — POCT I-STAT 7, (LYTES, BLD GAS, ICA,H+H)
Acid-Base Excess: 9 mmol/L — ABNORMAL HIGH (ref 0.0–2.0)
Bicarbonate: 33 mmol/L — ABNORMAL HIGH (ref 20.0–28.0)
Calcium, Ion: 1.11 mmol/L — ABNORMAL LOW (ref 1.15–1.40)
HCT: 33 % — ABNORMAL LOW (ref 36.0–46.0)
Hemoglobin: 11.2 g/dL — ABNORMAL LOW (ref 12.0–15.0)
O2 Saturation: 97 %
POTASSIUM: 3.3 mmol/L — AB (ref 3.5–5.1)
Sodium: 138 mmol/L (ref 135–145)
TCO2: 34 mmol/L — ABNORMAL HIGH (ref 22–32)
pCO2 arterial: 44.4 mmHg (ref 32.0–48.0)
pH, Arterial: 7.48 — ABNORMAL HIGH (ref 7.350–7.450)
pO2, Arterial: 83 mmHg (ref 83.0–108.0)

## 2018-04-30 LAB — CREATININE, SERUM
Creatinine, Ser: 0.86 mg/dL (ref 0.44–1.00)
GFR calc Af Amer: >60 mL/min (ref >60)
GFR calc non Af Amer: >60 mL/min (ref >60)

## 2018-04-30 LAB — CBC
HCT: 42.9 % (ref 36.0–46.0)
Hemoglobin: 13.8 g/dL (ref 12.0–15.0)
MCH: 29.4 pg (ref 26.0–34.0)
MCHC: 32.2 g/dL (ref 30.0–36.0)
MCV: 91.5 fL (ref 80.0–100.0)
Platelets: 206 10*3/uL (ref 150–400)
RBC: 4.69 MIL/uL (ref 3.87–5.11)
RDW: 13.9 % (ref 11.5–15.5)
WBC: 10.7 10*3/uL — ABNORMAL HIGH (ref 4.0–10.5)
nRBC: 0.2 % (ref 0.0–0.2)

## 2018-04-30 LAB — POTASSIUM: Potassium: 3.5 mmol/L (ref 3.5–5.1)

## 2018-04-30 LAB — BASIC METABOLIC PANEL
Anion gap: 14 (ref 5–15)
BUN: 17 mg/dL (ref 6–20)
CO2: 28 mmol/L (ref 22–32)
Calcium: 8.7 mg/dL — ABNORMAL LOW (ref 8.9–10.3)
Chloride: 98 mmol/L (ref 98–111)
Creatinine, Ser: 0.85 mg/dL (ref 0.44–1.00)
GFR calc Af Amer: >60 mL/min (ref >60)
GFR calc non Af Amer: >60 mL/min (ref >60)
Glucose, Bld: 245 mg/dL — ABNORMAL HIGH (ref 70–99)
Potassium: 3.6 mmol/L (ref 3.5–5.1)
Sodium: 140 mmol/L (ref 135–145)

## 2018-04-30 LAB — GLUCOSE, CAPILLARY
Glucose-Capillary: 180 mg/dL — ABNORMAL HIGH (ref 70–99)
Glucose-Capillary: 139 mg/dL — ABNORMAL HIGH (ref 70–99)
Glucose-Capillary: 285 mg/dL — ABNORMAL HIGH (ref 70–99)

## 2018-04-30 SURGERY — RIGHT/LEFT HEART CATH AND CORONARY ANGIOGRAPHY
Anesthesia: LOCAL

## 2018-04-30 MED ORDER — SODIUM CHLORIDE 0.9% FLUSH: 3.0000 mL | Freq: Two times a day (BID) | INTRAVENOUS | Status: DC

## 2018-04-30 MED ORDER — SODIUM CHLORIDE 0.9% FLUSH
3.0000 mL | Freq: Two times a day (BID) | INTRAVENOUS | Status: DC
  Administered 2018-04-30: 3 mL via INTRAVENOUS

## 2018-04-30 MED ORDER — DRONABINOL 2.5 MG PO CAPS
2.5000 mg | ORAL_CAPSULE | Freq: Two times a day (BID) | ORAL | Status: DC
  Administered 2018-04-30 – 2018-05-01 (×2): 2.5 mg via ORAL
  Filled 2018-04-30 (×3): qty 1

## 2018-04-30 MED ORDER — DILTIAZEM HCL ER BEADS 240 MG PO CP24
360.0000 mg | ORAL_CAPSULE | Freq: Every day | ORAL | Status: DC
  Administered 2018-04-30: 17:00:00 360 mg via ORAL
  Filled 2018-04-30 (×3): qty 1

## 2018-04-30 MED ORDER — CLOPIDOGREL BISULFATE 75 MG PO TABS
75.0000 mg | ORAL_TABLET | Freq: Every day | ORAL | Status: DC
  Administered 2018-05-01: 75 mg via ORAL
  Filled 2018-04-30: qty 1

## 2018-04-30 MED ORDER — VERAPAMIL HCL 2.5 MG/ML IV SOLN
INTRAVENOUS | Status: DC | PRN
  Administered 2018-04-30: 10 mL via INTRA_ARTERIAL

## 2018-04-30 MED ORDER — MIDAZOLAM HCL 2 MG/2ML IJ SOLN
INTRAMUSCULAR | Status: AC
  Filled 2018-04-30: qty 2

## 2018-04-30 MED ORDER — OXYCODONE HCL 5 MG PO TABS
5.0000 mg | ORAL_TABLET | ORAL | Status: DC | PRN
  Administered 2018-04-30 – 2018-05-01 (×5): 5 mg via ORAL
  Filled 2018-04-30 (×5): qty 1

## 2018-04-30 MED ORDER — PREDNISONE 5 MG PO TABS
25.0000 mg | ORAL_TABLET | Freq: Every day | ORAL | Status: DC
  Administered 2018-05-01: 25 mg via ORAL
  Filled 2018-04-30: qty 1

## 2018-04-30 MED ORDER — SULFASALAZINE 500 MG PO TABS
1500.0000 mg | ORAL_TABLET | Freq: Two times a day (BID) | ORAL | Status: DC
  Administered 2018-04-30 – 2018-05-01 (×2): 1500 mg via ORAL
  Filled 2018-04-30 (×2): qty 3

## 2018-04-30 MED ORDER — SODIUM CHLORIDE 0.9 % WEIGHT BASED INFUSION
3.0000 mL/kg/h | INTRAVENOUS | Status: DC
  Administered 2018-04-30: 3 mL/kg/h via INTRAVENOUS

## 2018-04-30 MED ORDER — MIDAZOLAM HCL 2 MG/2ML IJ SOLN
INTRAMUSCULAR | Status: DC | PRN
  Administered 2018-04-30: 2 mg via INTRAVENOUS

## 2018-04-30 MED ORDER — POTASSIUM CHLORIDE CRYS ER 20 MEQ PO TBCR
40.0000 meq | EXTENDED_RELEASE_TABLET | Freq: Three times a day (TID) | ORAL | Status: DC
  Administered 2018-04-30 – 2018-05-01 (×4): 40 meq via ORAL
  Filled 2018-04-30 (×4): qty 2

## 2018-04-30 MED ORDER — MONTELUKAST SODIUM 10 MG PO TABS
10.0000 mg | ORAL_TABLET | Freq: Every day | ORAL | Status: DC
  Administered 2018-04-30: 22:00:00 10 mg via ORAL
  Filled 2018-04-30: qty 1

## 2018-04-30 MED ORDER — INSULIN ASPART 100 UNIT/ML ~~LOC~~ SOLN: 0.0000 [IU] | Freq: Three times a day (TID) | SUBCUTANEOUS | Status: DC

## 2018-04-30 MED ORDER — SODIUM CHLORIDE 0.9 % IV SOLN: 250.0000 mL | INTRAVENOUS | Status: DC | PRN

## 2018-04-30 MED ORDER — SODIUM CHLORIDE 0.9 % WEIGHT BASED INFUSION: 1.0000 mL/kg/h | INTRAVENOUS | Status: DC

## 2018-04-30 MED ORDER — LEVOTHYROXINE SODIUM 125 MCG PO TABS
125.0000 ug | ORAL_TABLET | Freq: Every day | ORAL | Status: DC
  Administered 2018-05-01: 125 ug via ORAL
  Filled 2018-04-30: qty 1

## 2018-04-30 MED ORDER — ALBUTEROL SULFATE (2.5 MG/3ML) 0.083% IN NEBU: 2.5000 mg | INHALATION_SOLUTION | Freq: Four times a day (QID) | RESPIRATORY_TRACT | Status: DC | PRN

## 2018-04-30 MED ORDER — METOCLOPRAMIDE HCL 10 MG PO TABS
10.0000 mg | ORAL_TABLET | Freq: Two times a day (BID) | ORAL | Status: DC
  Administered 2018-04-30 – 2018-05-01 (×2): 10 mg via ORAL
  Filled 2018-04-30 (×2): qty 1

## 2018-04-30 MED ORDER — ASPIRIN 81 MG PO CHEW: 81.0000 mg | CHEWABLE_TABLET | ORAL | Status: DC

## 2018-04-30 MED ORDER — LOSARTAN POTASSIUM 50 MG PO TABS
50.0000 mg | ORAL_TABLET | Freq: Every day | ORAL | Status: DC
  Administered 2018-04-30 – 2018-05-01 (×2): 50 mg via ORAL
  Filled 2018-04-30 (×2): qty 1

## 2018-04-30 MED ORDER — SODIUM CHLORIDE 0.9 % IV SOLN
INTRAVENOUS | Status: AC
  Administered 2018-04-30: 16:00:00 via INTRAVENOUS

## 2018-04-30 MED ORDER — CLOPIDOGREL BISULFATE 75 MG PO TABS: 75.0000 mg | ORAL_TABLET | ORAL | Status: DC

## 2018-04-30 MED ORDER — ONDANSETRON HCL 8 MG PO TABS
8.0000 mg | ORAL_TABLET | Freq: Three times a day (TID) | ORAL | Status: DC | PRN
  Filled 2018-04-30: qty 1

## 2018-04-30 MED ORDER — FUROSEMIDE 10 MG/ML IJ SOLN
INTRAMUSCULAR | Status: AC
  Filled 2018-04-30: qty 4

## 2018-04-30 MED ORDER — INSULIN ASPART 100 UNIT/ML ~~LOC~~ SOLN: 0.0000 [IU] | Freq: Every day | SUBCUTANEOUS | Status: DC

## 2018-04-30 MED ORDER — LIDOCAINE HCL (PF) 1 % IJ SOLN
INTRAMUSCULAR | Status: DC | PRN
  Administered 2018-04-30 (×2): 2 mL

## 2018-04-30 MED ORDER — ACETAMINOPHEN 325 MG PO TABS: 650.0000 mg | ORAL_TABLET | ORAL | Status: DC | PRN

## 2018-04-30 MED ORDER — IOHEXOL 350 MG/ML SOLN
INTRAVENOUS | Status: DC | PRN
  Administered 2018-04-30: 70 mL via INTRACARDIAC

## 2018-04-30 MED ORDER — HEPARIN SODIUM (PORCINE) 5000 UNIT/ML IJ SOLN
5000.0000 [IU] | Freq: Three times a day (TID) | INTRAMUSCULAR | Status: DC
  Administered 2018-04-30 – 2018-05-01 (×2): 5000 [IU] via SUBCUTANEOUS
  Filled 2018-04-30 (×2): qty 1

## 2018-04-30 MED ORDER — NITROGLYCERIN 0.4 MG SL SUBL: 0.4000 mg | SUBLINGUAL_TABLET | SUBLINGUAL | Status: DC | PRN

## 2018-04-30 MED ORDER — INSULIN ASPART 100 UNIT/ML ~~LOC~~ SOLN
8.0000 [IU] | Freq: Once | SUBCUTANEOUS | Status: AC
  Administered 2018-04-30: 22:00:00 8 [IU] via SUBCUTANEOUS

## 2018-04-30 MED ORDER — FENTANYL CITRATE (PF) 100 MCG/2ML IJ SOLN
INTRAMUSCULAR | Status: DC | PRN
  Administered 2018-04-30 (×2): 25 ug via INTRAVENOUS

## 2018-04-30 MED ORDER — SODIUM CHLORIDE 0.9% FLUSH: 3.0000 mL | INTRAVENOUS | Status: DC | PRN

## 2018-04-30 MED ORDER — FUROSEMIDE 40 MG PO TABS
40.0000 mg | ORAL_TABLET | Freq: Every day | ORAL | Status: DC
  Administered 2018-05-01: 40 mg via ORAL
  Filled 2018-04-30: qty 1

## 2018-04-30 MED ORDER — LIDOCAINE HCL (PF) 1 % IJ SOLN
INTRAMUSCULAR | Status: AC
  Filled 2018-04-30: qty 30

## 2018-04-30 MED ORDER — UMECLIDINIUM BROMIDE 62.5 MCG/INH IN AEPB
1.0000 | INHALATION_SPRAY | Freq: Every day | RESPIRATORY_TRACT | Status: DC
  Filled 2018-04-30: qty 7

## 2018-04-30 MED ORDER — MAGNESIUM GLUCONATE 500 MG PO TABS
500.0000 mg | ORAL_TABLET | Freq: Every day | ORAL | Status: DC
  Administered 2018-04-30 – 2018-05-01 (×2): 500 mg via ORAL
  Filled 2018-04-30 (×2): qty 1

## 2018-04-30 MED ORDER — HEPARIN (PORCINE) IN NACL 1000-0.9 UT/500ML-% IV SOLN
INTRAVENOUS | Status: AC
  Filled 2018-04-30: qty 500

## 2018-04-30 MED ORDER — HEPARIN SODIUM (PORCINE) 1000 UNIT/ML IJ SOLN
INTRAMUSCULAR | Status: DC | PRN
  Administered 2018-04-30: 6000 [IU] via INTRAVENOUS

## 2018-04-30 MED ORDER — HEPARIN (PORCINE) IN NACL 1000-0.9 UT/500ML-% IV SOLN
INTRAVENOUS | Status: DC | PRN
  Administered 2018-04-30 (×2): 500 mL

## 2018-04-30 MED ORDER — FENTANYL CITRATE (PF) 100 MCG/2ML IJ SOLN
INTRAMUSCULAR | Status: AC
  Filled 2018-04-30: qty 2

## 2018-04-30 MED ORDER — MYCOPHENOLATE MOFETIL 250 MG PO CAPS
1000.0000 mg | ORAL_CAPSULE | Freq: Two times a day (BID) | ORAL | Status: DC
  Administered 2018-04-30 – 2018-05-01 (×2): 1000 mg via ORAL
  Filled 2018-04-30 (×2): qty 4

## 2018-04-30 MED ORDER — GABAPENTIN 300 MG PO CAPS
300.0000 mg | ORAL_CAPSULE | Freq: Four times a day (QID) | ORAL | Status: DC
  Administered 2018-04-30 – 2018-05-01 (×3): 300 mg via ORAL
  Filled 2018-04-30 (×3): qty 1

## 2018-04-30 MED ORDER — VERAPAMIL HCL 2.5 MG/ML IV SOLN
INTRAVENOUS | Status: AC
  Filled 2018-04-30: qty 2

## 2018-04-30 MED ORDER — LABETALOL HCL 5 MG/ML IV SOLN
INTRAVENOUS | Status: DC | PRN
  Administered 2018-04-30: 10 mg via INTRAVENOUS

## 2018-04-30 MED ORDER — LORAZEPAM 1 MG PO TABS: 1.0000 mg | ORAL_TABLET | ORAL | Status: DC | PRN

## 2018-04-30 MED ORDER — PROMETHAZINE HCL 12.5 MG PO TABS
12.5000 mg | ORAL_TABLET | Freq: Four times a day (QID) | ORAL | Status: DC | PRN
  Filled 2018-04-30: qty 1

## 2018-04-30 MED ORDER — FLUTICASONE FUROATE-VILANTEROL 100-25 MCG/INH IN AEPB
1.0000 | INHALATION_SPRAY | Freq: Every day | RESPIRATORY_TRACT | Status: DC
  Filled 2018-04-30: qty 28

## 2018-04-30 MED ORDER — PANTOPRAZOLE SODIUM 40 MG PO TBEC: 40.0000 mg | DELAYED_RELEASE_TABLET | Freq: Every day | ORAL | Status: DC

## 2018-04-30 MED ORDER — LINACLOTIDE 145 MCG PO CAPS
290.0000 ug | ORAL_CAPSULE | Freq: Every day | ORAL | Status: DC
  Filled 2018-04-30: qty 2

## 2018-04-30 MED ORDER — SPIRONOLACTONE 25 MG PO TABS
25.0000 mg | ORAL_TABLET | Freq: Every day | ORAL | Status: DC
  Administered 2018-04-30 – 2018-05-01 (×2): 25 mg via ORAL
  Filled 2018-04-30 (×2): qty 1

## 2018-04-30 MED ORDER — LABETALOL HCL 5 MG/ML IV SOLN
INTRAVENOUS | Status: AC
  Filled 2018-04-30: qty 4

## 2018-04-30 MED ORDER — METOLAZONE 5 MG PO TABS
5.0000 mg | ORAL_TABLET | Freq: Every day | ORAL | Status: DC
  Administered 2018-05-01: 5 mg via ORAL
  Filled 2018-04-30: qty 1

## 2018-04-30 MED ORDER — FLUTICASONE-UMECLIDIN-VILANT 100-62.5-25 MCG/INH IN AEPB: 1.0000 | INHALATION_SPRAY | Freq: Every day | RESPIRATORY_TRACT | Status: DC

## 2018-04-30 MED ORDER — FUROSEMIDE 10 MG/ML IJ SOLN
INTRAMUSCULAR | Status: DC | PRN
  Administered 2018-04-30: 40 mg via INTRAVENOUS

## 2018-04-30 MED ORDER — ISOSORBIDE MONONITRATE ER 30 MG PO TB24
30.0000 mg | ORAL_TABLET | Freq: Every day | ORAL | Status: DC
  Administered 2018-04-30 – 2018-05-01 (×2): 30 mg via ORAL
  Filled 2018-04-30 (×2): qty 1

## 2018-04-30 MED ORDER — LORATADINE 10 MG PO TABS
10.0000 mg | ORAL_TABLET | Freq: Every day | ORAL | Status: DC
  Administered 2018-05-01: 10 mg via ORAL
  Filled 2018-04-30: qty 1

## 2018-04-30 SURGICAL SUPPLY — 13 items
CATH BALLN WEDGE 5F 110CM (CATHETERS) ×1 IMPLANT
CATH INFINITI 5FR ANG PIGTAIL (CATHETERS) ×1 IMPLANT
CATH OPTITORQUE TIG 4.0 5F (CATHETERS) ×1 IMPLANT
DEVICE RAD COMP TR BAND LRG (VASCULAR PRODUCTS) ×1 IMPLANT
GLIDESHEATH SLEND A-KIT 6F 22G (SHEATH) ×1 IMPLANT
GUIDEWIRE INQWIRE 1.5J.035X260 (WIRE) IMPLANT
INQWIRE 1.5J .035X260CM (WIRE) ×2
KIT HEART LEFT (KITS) ×2 IMPLANT
PACK CARDIAC CATHETERIZATION (CUSTOM PROCEDURE TRAY) ×2 IMPLANT
SHEATH GLIDE SLENDER 4/5FR (SHEATH) ×1 IMPLANT
SHEATH PROBE COVER 6X72 (BAG) ×1 IMPLANT
TRANSDUCER W/STOPCOCK (MISCELLANEOUS) ×2 IMPLANT
TUBING CIL FLEX 10 FLL-RA (TUBING) ×2 IMPLANT

## 2018-04-30 NOTE — Progress Notes (Signed)
TR BAND REMOVAL  LOCATION:    right radial  DEFLATED PER PROTOCOL:    Yes.    TIME BAND OFF / DRESSING APPLIED:    1830   SITE UPON ARRIVAL:    Level 0  SITE AFTER BAND REMOVAL:    Level 0  CIRCULATION SENSATION AND MOVEMENT:    Within Normal Limits   Yes.    COMMENTS:    

## 2018-04-30 NOTE — Telephone Encounter (Signed)
Labs received and scanned in to epic

## 2018-04-30 NOTE — Interval H&P Note (Signed)
History and Physical Interval Note:  04/30/2018 1:41 PM  Elizabeth Higgins  has presented today for surgery, with the diagnosis of cp -concerning for progressive angina.  The various methods of treatment have been discussed with the patient and family. After consideration of risks, benefits and other options for treatment, the patient has consented to  Procedure(s): RIGHT/LEFT HEART CATH AND CORONARY ANGIOGRAPHY (N/A) with possible PERCUTANEOUS CORONARY INTERVENTION as a surgical intervention .  The patient's history has been reviewed, patient examined, no change in status, stable for surgery.  I have reviewed the patient's chart and labs.  Questions were answered to the patient's satisfaction.    Cath Lab Visit (complete for each Cath Lab visit)  Clinical Evaluation Leading to the Procedure:   ACS: No.  Non-ACS:    Anginal Classification: CCS III  Anti-ischemic medical therapy: Minimal Therapy (1 class of medications)  Non-Invasive Test Results: No non-invasive testing performed  Prior CABG: No previous CABG    Bryan Lemma

## 2018-05-01 ENCOUNTER — Other Ambulatory Visit (HOSPITAL_COMMUNITY): Payer: Self-pay | Admitting: Physician Assistant

## 2018-05-01 ENCOUNTER — Other Ambulatory Visit: Payer: Self-pay | Admitting: Physician Assistant

## 2018-05-01 DIAGNOSIS — M3320 Polymyositis, organ involvement unspecified: Secondary | ICD-10-CM

## 2018-05-01 DIAGNOSIS — E876 Hypokalemia: Secondary | ICD-10-CM

## 2018-05-01 DIAGNOSIS — I5033 Acute on chronic diastolic (congestive) heart failure: Secondary | ICD-10-CM | POA: Diagnosis not present

## 2018-05-01 DIAGNOSIS — E039 Hypothyroidism, unspecified: Secondary | ICD-10-CM | POA: Diagnosis not present

## 2018-05-01 DIAGNOSIS — R000 Tachycardia, unspecified: Secondary | ICD-10-CM

## 2018-05-01 DIAGNOSIS — I472 Ventricular tachycardia: Secondary | ICD-10-CM

## 2018-05-01 DIAGNOSIS — I25110 Atherosclerotic heart disease of native coronary artery with unstable angina pectoris: Secondary | ICD-10-CM | POA: Diagnosis not present

## 2018-05-01 DIAGNOSIS — I10 Essential (primary) hypertension: Secondary | ICD-10-CM

## 2018-05-01 DIAGNOSIS — R072 Precordial pain: Secondary | ICD-10-CM | POA: Diagnosis not present

## 2018-05-01 DIAGNOSIS — G4733 Obstructive sleep apnea (adult) (pediatric): Secondary | ICD-10-CM

## 2018-05-01 DIAGNOSIS — R0602 Shortness of breath: Secondary | ICD-10-CM | POA: Diagnosis not present

## 2018-05-01 DIAGNOSIS — Z9989 Dependence on other enabling machines and devices: Secondary | ICD-10-CM

## 2018-05-01 DIAGNOSIS — R9430 Abnormal result of cardiovascular function study, unspecified: Secondary | ICD-10-CM

## 2018-05-01 DIAGNOSIS — I471 Supraventricular tachycardia: Secondary | ICD-10-CM

## 2018-05-01 LAB — COMPREHENSIVE METABOLIC PANEL
ALT: 24 U/L (ref 0–44)
AST: 19 U/L (ref 15–41)
Albumin: 3.2 g/dL — ABNORMAL LOW (ref 3.5–5.0)
Alkaline Phosphatase: 47 U/L (ref 38–126)
Anion gap: 9 (ref 5–15)
BILIRUBIN TOTAL: 0.6 mg/dL (ref 0.3–1.2)
BUN: 10 mg/dL (ref 6–20)
CO2: 30 mmol/L (ref 22–32)
Calcium: 8.1 mg/dL — ABNORMAL LOW (ref 8.9–10.3)
Chloride: 102 mmol/L (ref 98–111)
Creatinine, Ser: 0.72 mg/dL (ref 0.44–1.00)
GFR calc Af Amer: >60 mL/min (ref >60)
GLUCOSE: 124 mg/dL — AB (ref 70–99)
Potassium: 3.3 mmol/L — ABNORMAL LOW (ref 3.5–5.1)
Sodium: 141 mmol/L (ref 135–145)
Total Protein: 5.7 g/dL — ABNORMAL LOW (ref 6.5–8.1)

## 2018-05-01 LAB — GLUCOSE, CAPILLARY
Glucose-Capillary: 115 mg/dL — ABNORMAL HIGH (ref 70–99)
Glucose-Capillary: 149 mg/dL — ABNORMAL HIGH (ref 70–99)

## 2018-05-01 LAB — HEMOGLOBIN A1C
Hgb A1c MFr Bld: 6.8 % — ABNORMAL HIGH (ref 4.8–5.6)
Mean Plasma Glucose: 148.46 mg/dL

## 2018-05-01 LAB — BRAIN NATRIURETIC PEPTIDE: B Natriuretic Peptide: 37.6 pg/mL (ref 0.0–100.0)

## 2018-05-01 MED ORDER — FUROSEMIDE 40 MG PO TABS: 40.0000 mg | ORAL_TABLET | Freq: Every day | ORAL | 6 refills | Status: DC

## 2018-05-01 MED ORDER — POTASSIUM CHLORIDE CRYS ER 10 MEQ PO TBCR: 30.0000 meq | EXTENDED_RELEASE_TABLET | Freq: Two times a day (BID) | ORAL | 3 refills | Status: DC

## 2018-05-01 MED ORDER — ORAL CARE MOUTH RINSE: 15.0000 mL | Freq: Two times a day (BID) | OROMUCOSAL | Status: DC

## 2018-05-01 MED ORDER — DILTIAZEM HCL ER COATED BEADS 360 MG PO CP24: 360.0000 mg | ORAL_CAPSULE | Freq: Every day | ORAL | 3 refills | Status: AC

## 2018-05-01 MED ORDER — METOLAZONE 5 MG PO TABS: ORAL_TABLET | ORAL | 3 refills | Status: DC

## 2018-05-01 MED ORDER — SPIRONOLACTONE 25 MG PO TABS: 25.0000 mg | ORAL_TABLET | Freq: Every day | ORAL | 3 refills | Status: AC

## 2018-05-01 MED ORDER — DILTIAZEM HCL ER COATED BEADS 180 MG PO CP24
360.0000 mg | ORAL_CAPSULE | Freq: Every day | ORAL | Status: DC
  Administered 2018-05-01: 360 mg via ORAL
  Filled 2018-05-01: qty 2

## 2018-05-01 NOTE — Discharge Summary (Addendum)
Discharge Summary    Patient ID: Elizabeth Higgins MRN: [id number]; DOB: 12/04/1965  Admit date: 04/30/2018 Discharge date: 05/01/2018  Primary Care Provider: Doreen SalvageBulla, Donald, PA-C  Primary Cardiologist: Rollene RotundaJames Hochrein, MD   Discharge Diagnoses    Principal Problem:   Coronary artery disease involving native coronary artery of native heart with unstable angina pectoris [facility](HCC) Active Problems:   Tachycardia   SOB (shortness of breath)   Precordial pain   Acute on chronic diastolic heart failure (HCC)   Hypokalemia due to excessive renal loss of potassium   Allergies Allergies  Allergen Reactions  . Cymbalta [Duloxetine Hcl] Nausea And Vomiting  . Demerol [Meperidine Hcl] Itching  . Penicillins Other (See Comments)    Viviann SpareSteven Johnson's Syndrome Did it involve swelling of the face/tongue/throat, SOB, or low BP? Yes Did it involve sudden or severe rash/hives, skin peeling, or any reaction on the inside of your mouth or nose? Yes Did you need to seek medical attention at a hospital or doctor's office? Yes When did it last happen?2013 If all above answers are "NO", may proceed with cephalosporin use.  . Rocephin [Ceftriaxone] Other (See Comments)    Viviann SpareSteven Johnson's Syndrome     Diagnostic Studies/Procedures    RIGHT/LEFT HEART CATH AND CORONARY ANGIOGRAPHY  Conclusion     Angiographically normal coronary arteries. Separate ostia for circumflex.  Hemodynamic findings consistent with mild pulmonary hypertension (secondary to Pulmonary Venous Congestion from Acute on Chronic Combined Diastolic Heart Failure)  Hyperdynamic left ventricle. Significant catheter related ectopy -multiple PVCs with runs of VT and SVT.   SUMMARY  Angiographically normal coronary arteries.  Separate ostium for AV groove circumflex  Severely elevated LVEDP and PCWP  Only mild pulmonary pretension secondary to pulmonary venous congestion. --Consistent with ACUTE ON CHRONIC DIASTOLIC HEART  FAILURE  Hyperdynamic left ventricle.  Unable to assess LV due to persistent catheter related arrhythmia.   RECOMMENDATIONS  Due to her hypokalemia upon arrival and need for IV diuretics, I am in a place her in observation status overnight in order to monitor post IV Lasix given in the Cath Lab.  We will add spironolactone and increase potassium supplementation.  She has been on Zaroxolyn without Lasix because of hypokalemia, will add Lasix back in addition to the spironolactone.  Titrate diltiazem to 360 mg because of tachycardia and hyperdynamic LV.  (Has been intolerant to beta-blockers, could consider bisoprolol)  Provided her medication regimen is stabilized, would anticipate that she should be able be discharged tomorrow.      History of Present Illness     Elizabeth Higgins is a 53 y.o. 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  She did have an abnormal stress test with chest pain in 2012.  She reports a cardiac catheterization (no reports), she recalls having a 30% right coronary artery lesion.  She was having chest pain last year.  She had a stress perfusion study which did not suggest ischemia.  However, she had chest discomfort.    [facility].  She ruled out for myocardial infarction.  However, because of her risk factors and symptoms the plan was for cardiac CT. Unfortunately they could not get her heart rate down despite very high doses of beta-blockers.  She is referred to Dr. Antoine PocheHochrein for consideration of cardiac catheterization by her cardiology practice. She prefers to have this done at our institution rather than  [place].  She continued to have chest discomfort intermittently.  Typically is going away with one nitroglycerin.  She is on Imdur.  She is also had some tachypalpitations.  She is  recorded heart rates in the 160s on her pulse ox.  She is not describing presyncope or syncope. Her chest pain is consistent with unstable angina, recommended cath.   Hospital Course     Consultants: None  Cath showed no evidence of CAD.  Findings consistent with mild pulmonary hypertension secondary to vascular congestion due to acute on chronic diastolic heart failure.  Severely elevated LVEDP and PCWP.  She had catheter related ectopy with multiple PVCs with runs of VT and SVT. Tolerated procedure well.  No complication.  Given tachycardia and elevated EDP, her diltiazem increased to 360 mg, given IV Lasix now on p.o.  She diuresed 3.6 L overnight.  Her potassium remained 3.3 despite addition of spironolactone and supplemental potassium.  Raising concern for endocrine disorder.  Recommended outpatient evaluation with 24-hour urine for PCP.   Will reduce Zaroxolyn to 3 times per week given addition of Lasix.  Discharge Vitals Blood pressure (!) 160/71, pulse 86, temperature 98.8 F (37.1 C), temperature source Oral, resp. rate 18, height 5\' 3"  (1.6 m), weight 131.8 kg, SpO2 97 %.  Filed Weights   04/30/18 0938 05/01/18 0457  Weight: 130.6 kg 131.8 kg   Physical Exam  Constitutional: She is oriented to person, place, and time.  Morbidly obese female in no acute distress  HENT:  Head: Normocephalic and atraumatic.  Eyes: Pupils are equal, round, and reactive to light. Conjunctivae are normal.  Neck: Normal range of motion. Neck supple.  Cardiovascular: Normal rate and regular rhythm.  Right radial cath site without hematoma  Pulmonary/Chest: Effort normal and breath sounds normal.  Abdominal: Soft. Bowel sounds are normal.  Musculoskeletal: Normal range of motion.  Neurological: She is alert and oriented to person, place, and time.  Skin: Skin is warm and dry.  Psychiatric: Affect normal.   ex Labs & Radiologic Studies    CBC Recent Labs    04/30/18 1442 04/30/18 1828  WBC  --   10.7*  HGB 11.2* 13.8  HCT 33.0* 42.9  MCV  --  91.5  PLT  --  206   Basic Metabolic Panel Recent Labs    91/47/82 2128 05/01/18 0542  NA 140 141  K 3.6 3.3*  CL 98 102  CO2 28 30  GLUCOSE 245* 124*  BUN 17 10  CREATININE 0.85 0.72  CALCIUM 8.7* 8.1*   Liver Function Tests Recent Labs    05/01/18 0542  AST 19  ALT 24  ALKPHOS 47  BILITOT 0.6  PROT 5.7*  ALBUMIN 3.2*   Hemoglobin A1C Recent Labs    05/01/18 0542  HGBA1C 6.8*    Disposition   Pt is being discharged home today in good condition.  Follow-up Plans & Appointments    Follow-up Information    Jodelle Gross, NP. Go on 05/11/2018.   Specialties:  Nurse Practitioner, Radiology, Cardiology Why:  @8 :30am for cath follow up  Contact information: [address] [place] [address] [phone]        Doreen Salvage, [place]. Schedule an appointment as soon as possible for a visit in 1 week(s).   Specialty:  Internal Medicine Contact information: [address] [place] [address] [place] [address] [phone]        [facility]. Go on 05/03/2018.   Specialty:  Cardiology Why:  for blood work for check BMP Contact information: [address] [place] [address] [phone]         Discharge Instructions    Diet - low sodium heart healthy   Complete by:  As directed    Discharge instructions   Complete by:  As directed    No driving for 48 hours. No lifting over 5 lbs for 1 week. No sexual activity for 1 week.  Keep procedure site clean & dry. If you notice increased pain, swelling, bleeding or pus, call/return!  You may shower, but no soaking baths/hot tubs/pools for 1 week.   Increase activity slowly   Complete by:  As directed       Discharge Medications   Allergies as of 05/01/2018      Reactions   Cymbalta [duloxetine Hcl] Nausea And Vomiting   Demerol [meperidine Hcl] Itching   Penicillins Other (See Comments)   Viviann Spare Johnson's  Syndrome Did it involve swelling of the face/tongue/throat, SOB, or low BP? Yes Did it involve sudden or severe rash/hives, skin peeling, or any reaction on the inside of your mouth or nose? Yes Did you need to seek medical attention at a hospital or doctor's office? Yes When did it last happen?2013 If all above answers are "NO", may proceed with cephalosporin use.   Rocephin [ceftriaxone] Other (See Comments)   Viviann Spare Johnson's Syndrome       Medication List    STOP taking these medications   diltiazem 240 MG 24 hr capsule Commonly known as:  TIAZAC     TAKE these medications   albuterol (2.5 MG/3ML) 0.083% nebulizer solution Commonly known as:  PROVENTIL Take 2.5 mg by nebulization every 6 (six) hours as needed for wheezing or shortness of breath.   albuterol 108 (90 Base) MCG/ACT inhaler Commonly known as:  PROVENTIL HFA;VENTOLIN HFA Inhale 2 puffs into the lungs every 4 (four) hours as needed for wheezing or shortness of breath.   allopurinol 100 MG tablet Commonly known as:  ZYLOPRIM Take 100 mg by mouth daily.   baclofen 10 MG tablet Commonly known as:  LIORESAL Take 10 mg by mouth 4 (four) times daily as needed for muscle spasms.   cetirizine 10 MG tablet Commonly known as:  ZYRTEC Take 10 mg by mouth daily.   clopidogrel 75 MG tablet Commonly known as:  PLAVIX Take 75 mg by mouth daily.   diltiazem 360 MG 24 hr capsule Commonly known as:  CARDIZEM CD Take 1 capsule (360 mg total) by mouth daily. Start taking on:  May 02, 2018   dronabinol 2.5 MG capsule Commonly known as:  MARINOL Take 2.5 mg by mouth 2 (two) times daily before lunch and supper.   ergocalciferol 1.25 MG (50000 UT) capsule Commonly known as:  VITAMIN D2 Take 50,000 Units by mouth 2 (two) times a week. Monday and Thursday   FOLATE PO Take 800 mg by mouth daily.   furosemide 40 MG tablet Commonly known as:  LASIX Take 1 tablet (40 mg total) by mouth daily. Start taking on:   May 02, 2018   gabapentin 300 MG capsule Commonly known as:  NEURONTIN Take 300 mg by mouth 4 (four) times daily.   hydrocortisone 2.5 % cream Apply 1 application topically 2 (two) times daily as needed (psoriasis).   isosorbide mononitrate 30 MG 24 hr tablet Commonly known as:  IMDUR Take 30 mg by mouth daily.   levothyroxine 125 MCG tablet Commonly  known as:  SYNTHROID, LEVOTHROID Take 125 mcg by mouth daily before breakfast.   linaclotide 290 MCG Caps capsule Commonly known as:  LINZESS Take 290 mcg by mouth daily before breakfast.   LORazepam 1 MG tablet Commonly known as:  ATIVAN Take 1-2 mg by mouth every 4 (four) hours as needed for anxiety.   losartan 50 MG tablet Commonly known as:  COZAAR Take 50 mg by mouth daily.   magnesium gluconate 500 MG tablet Commonly known as:  MAGONATE Take 500 mg by mouth daily.   metoCLOPramide 10 MG tablet Commonly known as:  REGLAN Take 10 mg by mouth 2 (two) times daily.   metolazone 5 MG tablet Commonly known as:  ZAROXOLYN Take 1 table 30 minutes prior to taking lasix on Monday, Wednesday and Friday What changed:    how much to take  how to take this  when to take this  additional instructions   montelukast 10 MG tablet Commonly known as:  SINGULAIR Take 10 mg by mouth at bedtime.   mycophenolate 500 MG tablet Commonly known as:  CELLCEPT Take 1,000 mg by mouth 2 (two) times daily.   nitroGLYCERIN 0.4 MG SL tablet Commonly known as:  NITROSTAT Place 0.4 mg under the tongue every 5 (five) minutes as needed for chest pain.   NOVOLOG 100 UNIT/ML injection Generic drug:  insulin aspart Inject 0-15 Units into the skin 3 (three) times daily before meals. Per sliding scale, 150-200 = 4 units 201-250 = 6 units 251-300 = 8 units 301-350 = 10 units 350> = 15 units   ondansetron 8 MG tablet Commonly known as:  ZOFRAN Take 8 mg by mouth every 8 (eight) hours as needed for nausea or vomiting.   oxyCODONE 5  MG immediate release tablet Commonly known as:  Oxy IR/ROXICODONE Take 5 mg by mouth every 4 (four) hours as needed for severe pain.   pantoprazole 40 MG tablet Commonly known as:  PROTONIX Take 40 mg by mouth 2 (two) times daily.   potassium chloride 10 MEQ tablet Commonly known as:  K-DUR,KLOR-CON Take 3 tablets (30 mEq total) by mouth 2 (two) times daily. Take additional 3 tablets on day of Zaroxylene Monday, Wednesday and Friday What changed:  additional instructions   predniSONE 10 MG tablet Commonly known as:  DELTASONE Take 25 mg by mouth daily with breakfast.   promethazine 12.5 MG tablet Commonly known as:  PHENERGAN Take 12.5 mg by mouth every 6 (six) hours as needed for nausea or vomiting.   spironolactone 25 MG tablet Commonly known as:  ALDACTONE Take 1 tablet (25 mg total) by mouth daily. Start taking on:  May 02, 2018   sulfaSALAzine 500 MG tablet Commonly known as:  AZULFIDINE Take 1,500 mg by mouth 2 (two) times daily.   TRELEGY ELLIPTA 100-62.5-25 MCG/INH Aepb Generic drug:  Fluticasone-Umeclidin-Vilant Inhale 1 puff into the lungs daily.        Acute coronary syndrome (MI, NSTEMI, STEMI, etc) this admission?: No.    Outstanding Labs/Studies   BMET early next week  Duration of Discharge Encounter   Greater than 30 minutes including physician time.  SignedManson Passey, PA 05/01/2018, 10:00 AM  The patient was seen and examined, and I agree with the history, physical exam, assessment and plan as documented above, with modifications as noted below.  We had an extensive discussion (myself, patient, and her daughter) about several of her medical issues including a detailed discussion regarding intracardiac hemodynamics.  She is very  grateful for the care she has received thus far and intends to follow up with Dr. Antoine PocheHochrein and her PCP.  Several medication adjustments have been made as detailed above with addition of spironolactone and  increase of diltiazem along with the addition of Lasix and increase potassium supplementation.  She is mildly hypokalemic in spite of this this morning and we will have a potassium level checked in 2 days.  Blood pressure and volume status will need close monitoring in the outpatient setting.  She is stable for discharge.   Prentice DockerSuresh Pollie Poma, MD, [facility] Center[facility]ACC  05/01/2018 10:00 AM

## 2018-05-03 ENCOUNTER — Encounter (HOSPITAL_COMMUNITY): Payer: Self-pay | Admitting: Cardiology

## 2018-05-03 LAB — BASIC METABOLIC PANEL
BUN/Creatinine Ratio: 24 — ABNORMAL HIGH (ref 9–23)
BUN: 25 mg/dL — ABNORMAL HIGH (ref 6–24)
CO2: 29 mmol/L (ref 20–29)
Calcium: 10.3 mg/dL — ABNORMAL HIGH (ref 8.7–10.2)
Chloride: 91 mmol/L — ABNORMAL LOW (ref 96–106)
Creatinine, Ser: 1.05 mg/dL — ABNORMAL HIGH (ref 0.57–1.00)
GFR calc Af Amer: 70 mL/min/{1.73_m2} (ref >59)
GFR, EST NON AFRICAN AMERICAN: 60 mL/min/{1.73_m2} (ref >59)
Glucose: 110 mg/dL — ABNORMAL HIGH (ref 65–99)
Potassium: 4.1 mmol/L (ref 3.5–5.2)
Sodium: 139 mmol/L (ref 134–144)

## 2018-05-03 LAB — CBC
Hematocrit: 40.5 % (ref 34.0–46.6)
Hemoglobin: 13.6 g/dL (ref 11.1–15.9)
MCH: 30 pg (ref 26.6–33.0)
MCHC: 33.6 g/dL (ref 31.5–35.7)
MCV: 89 fL (ref 79–97)
Platelets: 207 10*3/uL (ref 150–450)
RBC: 4.54 x10E6/uL (ref 3.77–5.28)
RDW: 14.2 % (ref 11.7–15.4)
WBC: 10.3 10*3/uL (ref 3.4–10.8)

## 2018-05-05 ENCOUNTER — Telehealth: Payer: Self-pay

## 2018-05-05 NOTE — Telephone Encounter (Signed)
Pt pharmacy is requesting refill for potassium 10 meq. Pharmacy asked if pt is at risk for hyperkalemia since they are taking potassium and spironolactone. Please address thank you.

## 2018-05-05 NOTE — Telephone Encounter (Signed)
PER MYCHART MESSAGE: My daughter Elizabeth Higgins has been my care taker for me since my cath on Friday Can you please respond and attach a work note for her for Friday through today. She will return tomorrow Thank you Tereasa CoopJudith Dor Pt notified to wait until appt for the out of work letter.

## 2018-05-06 MED ORDER — POTASSIUM CHLORIDE ER 10 MEQ PO TBCR: EXTENDED_RELEASE_TABLET | ORAL | 3 refills | Status: DC

## 2018-05-06 NOTE — Addendum Note (Signed)
Addended by: Neta Ehlers on: 05/06/2018 08:45 AM   Modules accepted: Orders

## 2018-05-10 ENCOUNTER — Telehealth: Payer: Self-pay | Admitting: Cardiology

## 2018-05-10 NOTE — Telephone Encounter (Signed)
° °  Patient requesting call regarding her MyChart message sent over the weekend.

## 2018-05-10 NOTE — Telephone Encounter (Signed)
OK to wait until the appt.

## 2018-05-10 NOTE — Telephone Encounter (Signed)
Dr Herbie Baltimore did increase Diltiazem to 360 mg daily which she has been doing. Patient was told to call the office if heart rate remained elevated. She would like Dr Antoine Poche to review cath and give recommendation.   In my cath report, Dr Herbie Baltimore said  "Titrate diltiazem to 360 mg because of tachycardia and hyperdynamic LV. (Has been intolerant to beta-blockers, could consider bisoprolol)"  Over the course of this week my resting heart rate range 105-125 with exertion rate up to 150 with SHOB. I have a follow up with Dr Montel Clock 05/21/18. Please review with MD if beta blocker should be called in or should I wait until appt. please advise  Thank you  Elizabeth Higgins dob 2065/09/21   Will forward to Dr Antoine Poche for review

## 2018-05-11 ENCOUNTER — Ambulatory Visit: Payer: BLUE CROSS/BLUE SHIELD | Admitting: Adult Health

## 2018-05-11 NOTE — Telephone Encounter (Signed)
Pt aware of Dr Antoine Poche responds

## 2018-05-12 ENCOUNTER — Ambulatory Visit: Payer: Managed Care, Other (non HMO) | Admitting: Cardiovascular Disease

## 2018-05-13 ENCOUNTER — Other Ambulatory Visit: Payer: Self-pay | Admitting: Internal Medicine

## 2018-05-13 DIAGNOSIS — R2232 Localized swelling, mass and lump, left upper limb: Secondary | ICD-10-CM

## 2018-05-18 ENCOUNTER — Ambulatory Visit: Payer: BLUE CROSS/BLUE SHIELD | Admitting: Adult Health

## 2018-05-20 NOTE — Progress Notes (Signed)
Cardiology Office Note   Date:  05/21/2018   ID:  Elizabeth Higgins, DOB January 21, 1966, MRN [id number]  PCP:  Doreen Salvage, PA-C  Cardiologist:   Rollene Rotunda, MD Referring:  Perley Jain   Chief Complaint  Patient presents with  . Shortness of Breath     History of Present Illness: Elizabeth Higgins is a 53 y.o. female who is referred by Doreen Salvage, PA-C  for evaluation of chest pain.  She was evaluated at [facility] for this.   She has a somewhat complicated past medical history with myositis managed at [facility].  This is left her with significant weakness.  She gets around with a walker.  She did have an abnormal stress test with chest pain in 2012.  She reports a cardiac catheterization that I do not have but she recalls having a 30% right coronary artery lesion.  She was having chest pain last year.  She had a stress perfusion study which did not suggest ischemia. She presented to me with continued chest pain.  She had cath as below without obstructive disease.  Pressures are recorded.   She was found to have significant elevation of her EDP and wedge.  She was observed overnight for diuresis.  She had her Cardizem increased for rate control.    She saw her primary provider recently and had beta-blocker added and her heart rate has come down.  She thinks her weight is down about 8 pounds.  However, her creatinine is up to 1.7.  She is breathing much better.  She denies any new shortness of breath, PND or orthopnea.  She has had no new palpitations, presyncope or syncope.  She is not had any chest discomfort.  She is having continued significant problems with her myositis.  She is getting an MRI because of left arm pain.   Past Medical History:  Diagnosis Date  . Autoimmune myopathy   . Bruit   . Dermatomycosis   . Diabetes mellitus type 2, controlled (HCC)   . DVT (deep vein thrombosis) in pregnancy   . Hyperlipemia, mixed   . Hypertension   . Hypothyroidism   . Intermittent  cerebral ischemia   . Migraines    WITH AURA  . Osteoarthritis   . Polymyositis (HCC)   . PVC (premature ventricular contraction)   . Reactive depression (situational)   . Sleep apnea    CPAP with 02  . Spinal stenosis at L4-L5 level   . Vitamin D deficiency     Past Surgical History:  Procedure Laterality Date  . ABDOMINAL HYSTERECTOMY    . BILATERAL OOPHORECTOMY    . BREAST REDUCTION SURGERY Bilateral   . CESAREAN SECTION    . CHOLECYSTECTOMY    . GASTRECTOMY  2013   SLEEVE  . LEFT HEART CATH    . RIGHT/LEFT HEART CATH AND CORONARY ANGIOGRAPHY N/A 04/30/2018   Procedure: RIGHT/LEFT HEART CATH AND CORONARY ANGIOGRAPHY;  Surgeon: Marykay Lex, MD;  Location: [facility] INVASIVE CV LAB;  Service: Cardiovascular;  Laterality: N/A;  . TONSILLECTOMY       Current Outpatient Medications  Medication Sig Dispense Refill  . albuterol (PROVENTIL HFA;VENTOLIN HFA) 108 (90 Base) MCG/ACT inhaler Inhale 2 puffs into the lungs every 4 (four) hours as needed for wheezing or shortness of breath.    Marland Kitchen albuterol (PROVENTIL) (2.5 MG/3ML) 0.083% nebulizer solution Take 2.5 mg by nebulization every 6 (six) hours as needed for wheezing or shortness of breath.    Marland Kitchen  allopurinol (ZYLOPRIM) 100 MG tablet Take 100 mg by mouth daily.    . baclofen (LIORESAL) 10 MG tablet Take 10 mg by mouth 4 (four) times daily as needed for muscle spasms.    . cetirizine (ZYRTEC) 10 MG tablet Take 10 mg by mouth daily.    . clopidogrel (PLAVIX) 75 MG tablet Take 75 mg by mouth daily.    Marland Kitchen diltiazem (CARDIZEM CD) 360 MG 24 hr capsule Take 1 capsule (360 mg total) by mouth daily. 30 capsule 3  . dronabinol (MARINOL) 2.5 MG capsule Take 2.5 mg by mouth 2 (two) times daily before lunch and supper.     . ergocalciferol (VITAMIN D2) 1.25 MG (50000 UT) capsule Take 50,000 Units by mouth 2 (two) times a week. Monday and Thursday    . Fluticasone-Umeclidin-Vilant (TRELEGY ELLIPTA) 100-62.5-25 MCG/INH AEPB Inhale 1 puff into the lungs  daily.     . Folic Acid (FOLATE PO) Take 800 mg by mouth daily.    . furosemide (LASIX) 40 MG tablet Take 1 tablet (40 mg total) by mouth daily. 30 tablet 6  . gabapentin (NEURONTIN) 300 MG capsule Take 300 mg by mouth 4 (four) times daily.    . hydrocortisone 2.5 % cream Apply 1 application topically 2 (two) times daily as needed (psoriasis).     . insulin aspart (NOVOLOG) 100 UNIT/ML injection Inject 0-15 Units into the skin 3 (three) times daily before meals. Per sliding scale, 150-200 = 4 units 201-250 = 6 units 251-300 = 8 units 301-350 = 10 units 350> = 15 units    . isosorbide mononitrate (IMDUR) 30 MG 24 hr tablet Take 30 mg by mouth daily.    Marland Kitchen levothyroxine (SYNTHROID, LEVOTHROID) 125 MCG tablet Take 125 mcg by mouth daily before breakfast.    . linaclotide (LINZESS) 290 MCG CAPS capsule Take 290 mcg by mouth daily before breakfast.    . LORazepam (ATIVAN) 1 MG tablet Take 1-2 mg by mouth every 4 (four) hours as needed for anxiety.     Marland Kitchen losartan (COZAAR) 50 MG tablet Take 50 mg by mouth daily.    . magnesium gluconate (MAGONATE) 500 MG tablet Take 500 mg by mouth daily.    . metoCLOPramide (REGLAN) 10 MG tablet Take 10 mg by mouth 2 (two) times daily.    . metolazone (ZAROXOLYN) 5 MG tablet Take 1 table 30 minutes prior to taking lasix on Monday, Wednesday and Friday 30 tablet 3  . montelukast (SINGULAIR) 10 MG tablet Take 10 mg by mouth at bedtime.    . mycophenolate (CELLCEPT) 500 MG tablet Take 1,000 mg by mouth 2 (two) times daily.     . nebivolol (BYSTOLIC) 2.5 MG tablet Take 2.5 mg by mouth 2 (two) times daily.    . nitroGLYCERIN (NITROSTAT) 0.4 MG SL tablet Place 0.4 mg under the tongue every 5 (five) minutes as needed for chest pain.    Marland Kitchen ondansetron (ZOFRAN) 8 MG tablet Take 8 mg by mouth every 8 (eight) hours as needed for nausea or vomiting.     Marland Kitchen oxyCODONE (OXY IR/ROXICODONE) 5 MG immediate release tablet Take 5 mg by mouth every 4 (four) hours as needed for severe  pain.    . pantoprazole (PROTONIX) 40 MG tablet Take 40 mg by mouth 2 (two) times daily.     . potassium chloride (K-DUR) 10 MEQ tablet TAKE 3 TABLETS BY MOUTH TWICE DAILY AND TAKE ADDITONAL 3 TABLETS ON DAY OF ZAROXYLENE(MONDAY, WEDNESDAY AND FRIDAY) 220 tablet 3  .  predniSONE (DELTASONE) 10 MG tablet Take 25 mg by mouth daily with breakfast.     . promethazine (PHENERGAN) 12.5 MG tablet Take 12.5 mg by mouth every 6 (six) hours as needed for nausea or vomiting.    Marland Kitchen spironolactone (ALDACTONE) 25 MG tablet Take 1 tablet (25 mg total) by mouth daily. 30 tablet 3  . sulfaSALAzine (AZULFIDINE) 500 MG tablet Take 1,500 mg by mouth 2 (two) times daily.     No current facility-administered medications for this visit.     Allergies:   Cymbalta [duloxetine hcl]; Demerol [meperidine hcl]; Penicillins; and Rocephin [ceftriaxone]    ROS:  Please see the history of present illness.   Otherwise, review of systems are positive for joint pains, muscle aches.   All other systems are reviewed and negative.    PHYSICAL EXAM: VS:  BP (!) 100/50   Pulse (!) 59   Ht 5\' 3"  (1.6 m)   Wt 288 lb 12.8 oz (131 kg)   SpO2 95%   BMI 51.16 kg/m  , BMI Body mass index is 51.16 kg/m. GENERAL:  Well appearing NECK:  No jugular venous distention, waveform within normal limits, carotid upstroke brisk and symmetric, no bruits, no thyromegaly LUNGS:  Clear to auscultation bilaterally CHEST:  Unremarkable HEART:  PMI not displaced or sustained,S1 and S2 within normal limits, no S3, no S4, no clicks, no rubs, no murmurs ABD:  Flat, positive bowel sounds normal in frequency in pitch, no bruits, no rebound, no guarding, no midline pulsatile mass, no hepatomegaly, no splenomegaly EXT:  2 plus pulses throughout, no edema, no cyanosis no clubbing   EKG:  EKG is not ordered today.   CATH:   Fick Cardiac Output 5.89 L/min  Fick Cardiac Output Index 2.61 (L/min)/BSA  RA A Wave 21 mmHg  RA V Wave 16 mmHg  RA Mean 15  mmHg  RV Systolic Pressure 38 mmHg  RV Diastolic Pressure 15 mmHg  RV EDP 18 mmHg  PA Systolic Pressure 33 mmHg  PA Diastolic Pressure 18 mmHg  PA Mean 25 mmHg  PW A Wave 23 mmHg  PW V Wave 20 mmHg  PW Mean 19 mmHg  AO Systolic Pressure 153 mmHg  AO Diastolic Pressure 98 mmHg  AO Mean 123 mmHg  LV Systolic Pressure 157 mmHg  LV Diastolic Pressure 20 mmHg  LV EDP 25 mmHg  AOp Systolic Pressure 172 mmHg  AOp Diastolic Pressure 101 mmHg  AOp Mean Pressure 136 mmHg  LVp Systolic Pressure 156 mmHg  LVp Diastolic Pressure 18 mmHg  LVp EDP Pressure 22 mmHg  QP/QS 1  TPVR Index 9.59 HRUI  TSVR Index 47.15 HRUI  PVR SVR Ratio 0.06  TPVR/TSVR Ratio 0.2     Recent Labs: 05/01/2018: ALT 24; B Natriuretic Peptide 37.6 05/03/2018: BUN 25; Creatinine, Ser 1.05; Hemoglobin 13.6; Platelets 207; Potassium 4.1; Sodium 139    Lipid Panel No results found for: CHOL, TRIG, HDL, CHOLHDL, VLDL, LDLCALC, LDLDIRECT    Wt Readings from Last 3 Encounters:  05/21/18 288 lb 12.8 oz (131 kg)  05/01/18 290 lb 9.1 oz (131.8 kg)  04/27/18 285 lb (129.3 kg)      Other studies Reviewed: Additional studies/ records that were reviewed today include: Cath report Review of the above records demonstrates:  Please see elsewhere in the note.     ASSESSMENT AND PLAN:   CHEST PAIN:   There was no CAD.  No further cardiac work up is planned.   TACHYCARDIA:  She had  her Cardizem increased.   She now has beta-blocker added and this therapy is appropriate.  No change.   ACUTE ON CHRONIC DIASTOLIC DYSFUNCTION:    This is certainly a significant component of her dyspnea.  Rate control is important in this situation.  Salt and fluid restriction are absolutely necessary.  This is weighed against the difficulties with her muscle problems which require hydration.  Is also problematic because of the increased creatinine.  However, because dyspnea was such a significant complaint and her end-diastolic pressures were  so prominently elevated I will continue the diuretic for now.  She is getting her blood work checked weekly.  If her creatinine goes up we will have to back off.  We talked about salt.  We talked about fluid.  I also like to get a PYP scan to rule out any suggestion of amyloidosis.   Current medicines are reviewed at length with the patient today.  The patient does not have concerns regarding medicines.  The following changes have been made:  None  Labs/ tests ordered today include:    Orders Placed This Encounter  Procedures  . MYOCARDIAL AMYLOID IMAGING PLANAR & SPECT     Disposition:   FU with me in 2 months.    Signed, Rollene Rotunda, MD  05/21/2018 2:03 PM    [facility]

## 2018-05-21 ENCOUNTER — Encounter: Payer: Self-pay | Admitting: Cardiology

## 2018-05-21 ENCOUNTER — Ambulatory Visit (INDEPENDENT_AMBULATORY_CARE_PROVIDER_SITE_OTHER): Payer: BLUE CROSS/BLUE SHIELD | Admitting: Cardiology

## 2018-05-21 ENCOUNTER — Ambulatory Visit: Payer: BLUE CROSS/BLUE SHIELD | Admitting: Cardiology

## 2018-05-21 VITALS — BP 100/50 | HR 59 | Ht 63.0 in | Wt 288.8 lb

## 2018-05-21 DIAGNOSIS — R000 Tachycardia, unspecified: Secondary | ICD-10-CM | POA: Diagnosis not present

## 2018-05-21 DIAGNOSIS — R0602 Shortness of breath: Secondary | ICD-10-CM | POA: Diagnosis not present

## 2018-05-21 DIAGNOSIS — R079 Chest pain, unspecified: Secondary | ICD-10-CM

## 2018-05-21 DIAGNOSIS — I5033 Acute on chronic diastolic (congestive) heart failure: Secondary | ICD-10-CM | POA: Diagnosis not present

## 2018-05-21 NOTE — Patient Instructions (Signed)
Medication Instructions:  Continue current medications  If you need a refill on your cardiac medications before your next appointment, please call your pharmacy.  Labwork: None ordered   Testing/Procedures: Your physician has requested that you have a Amyloid Study. For further information please visit [email or web address] Please follow instruction sheet, as given.  Follow-Up: . Your physician recommends that you schedule a follow-up appointment in: 3 Months   At [facility], you and your health needs are our priority.  As part of our continuing mission to provide you with exceptional heart care, we have created designated Provider Care Teams.  These Care Teams include your primary Cardiologist (physician) and Advanced Practice Providers (APPs -  Physician Assistants and Nurse Practitioners) who all work together to provide you with the care you need, when you need it.  Thank you for choosing [facility] at [facility]!!

## 2018-05-22 ENCOUNTER — Ambulatory Visit (HOSPITAL_COMMUNITY)
Admission: RE | Admit: 2018-05-22 | Discharge: 2018-05-22 | Disposition: A | Discharge status: Home / Self Care | Payer: BLUE CROSS/BLUE SHIELD | Source: Ambulatory Visit | Attending: Internal Medicine | Admitting: Internal Medicine

## 2018-05-22 DIAGNOSIS — R2232 Localized swelling, mass and lump, left upper limb: Secondary | ICD-10-CM

## 2018-05-22 MED ORDER — GADOBUTROL 1 MMOL/ML IV SOLN
10.0000 mL | Freq: Once | INTRAVENOUS | Status: AC | PRN
  Administered 2018-05-22: 10 mL via INTRAVENOUS

## 2018-06-11 ENCOUNTER — Encounter (HOSPITAL_COMMUNITY): Payer: BLUE CROSS/BLUE SHIELD

## 2018-07-02 ENCOUNTER — Encounter (HOSPITAL_COMMUNITY): Payer: BLUE CROSS/BLUE SHIELD

## 2018-08-17 ENCOUNTER — Ambulatory Visit: Payer: BLUE CROSS/BLUE SHIELD | Admitting: Cardiology

## 2018-09-02 ENCOUNTER — Telehealth (HOSPITAL_COMMUNITY): Payer: Self-pay | Admitting: *Deleted

## 2018-09-02 NOTE — Telephone Encounter (Signed)
Close encounter 

## 2018-09-07 ENCOUNTER — Ambulatory Visit (HOSPITAL_COMMUNITY)
Admission: RE | Admit: 2018-09-07 | Discharge: 2018-09-07 | Disposition: A | Discharge status: Home / Self Care | Payer: BC Managed Care – PPO | Source: Ambulatory Visit | Attending: Cardiovascular Disease | Admitting: Cardiovascular Disease

## 2018-09-07 ENCOUNTER — Other Ambulatory Visit: Payer: Self-pay

## 2018-09-07 DIAGNOSIS — R0602 Shortness of breath: Secondary | ICD-10-CM | POA: Insufficient documentation

## 2018-09-07 DIAGNOSIS — I5033 Acute on chronic diastolic (congestive) heart failure: Secondary | ICD-10-CM | POA: Insufficient documentation

## 2018-09-07 MED ORDER — TECHNETIUM TC 99M PYROPHOSPHATE
21.6000 | Freq: Once | INTRAVENOUS | Status: AC
  Administered 2018-09-07: 21.6 via INTRAVENOUS

## 2018-09-09 ENCOUNTER — Encounter: Payer: Self-pay | Admitting: *Deleted

## 2018-09-09 ENCOUNTER — Telehealth: Payer: Self-pay | Admitting: Cardiology

## 2018-09-09 NOTE — Telephone Encounter (Signed)
Spoke with patient and she is very upset with her Amyloidosis test results. She did not want to wait until 6/30 to discuss possible options. Explained Dr Percival Spanish only doing virtual visits tomorrow (scheduled full) and then in hospital working next week. Did offer in office visit next week with PA, she declined. Patient denies any change in breathing but is feeling fatigue. She is ok with virtual visit. She does want for Dr Percival Spanish to know when they talk that she absolutely wants a definitive test done.   Will forward to Dr Percival Spanish for review   Notes recorded by Minus Breeding, MD on 09/09/2018 at 2:32 PM EDT  The results were equivocal (meaning non diagnostic.) She has an appt with me on the 30th and I can discuss how she is doing and the possibility of further testing.

## 2018-09-09 NOTE — Progress Notes (Addendum)
Virtual Visit via Video Note   This visit type was conducted due to national recommendations for restrictions regarding the COVID-19 Pandemic (e.g. social distancing) in an effort to limit this patient's exposure and mitigate transmission in our community.  Due to her co-morbid illnesses, this patient is at least at moderate risk for complications without adequate follow up.  This format is felt to be most appropriate for this patient at this time.  All issues noted in this document were discussed and addressed.  A limited physical exam was performed with this format.  Please refer to the patient's chart for her consent to telehealth for [facility].   Date:  09/10/2018   ID:  Elizabeth Higgins, DOB February 28, 1966, MRN [id number]  Patient Location: Home Provider Location: Home  PCP:  Egbert Garibaldi, PA-C  Cardiologist:  Minus Breeding, MD  Electrophysiologist:  None   Evaluation Performed:  Follow-Up Visit  Chief Complaint:  Dyspnea ]  History of Present Illness:    Elizabeth Higgins is a 53 y.o. female who presents for followup of diastolic HF.  She was evaluated at [facility] for chest pain.  She has a somewhat complicated past medical history with myositis managed at [facility].  This is left her .  She had cath as below without obstructive disease.  Pressures are recorded.   She was found to have significant elevation of her EDP and wedge.  She was observed overnight for diuresis.  She had her Cardizem increased for rate control.   I ordered a PYP scan which was equivocal for amyloid.    She called today to discuss this and had his virtual visit.  She is having increased weight gain and edema.  She is feeling it in her legs and in her abdomen.  She is describing increasing shortness of breath.  She has no new chest pressure, neck or arm discomfort.  Her weight is up about 8 pounds.  She continues to have increased dyspnea with any activities such as walking a short distance through her house.  She can  only get a few 100 feet.  She can usually get about 1000.  The patient does not have symptoms concerning for COVID-19 infection (fever, chills, cough, or new shortness of breath).    Past Medical History:  Diagnosis Date   Autoimmune myopathy    Bruit    Dermatomycosis    Diabetes mellitus type 2, controlled ([place])    DVT (deep vein thrombosis) in pregnancy    Hyperlipemia, mixed    Hypertension    Hypothyroidism    Intermittent cerebral ischemia    Migraines    WITH AURA   Osteoarthritis    Polymyositis (HCC)    PVC (premature ventricular contraction)    Reactive depression (situational)    Sleep apnea    CPAP with 02   Spinal stenosis at L4-L5 level    Vitamin D deficiency    Past Surgical History:  Procedure Laterality Date   ABDOMINAL HYSTERECTOMY     BILATERAL OOPHORECTOMY     BREAST REDUCTION SURGERY Bilateral    CESAREAN SECTION     CHOLECYSTECTOMY     GASTRECTOMY  2013   SLEEVE   LEFT HEART CATH     RIGHT/LEFT HEART CATH AND CORONARY ANGIOGRAPHY N/A 04/30/2018   Procedure: RIGHT/LEFT HEART CATH AND CORONARY ANGIOGRAPHY;  Surgeon: Leonie Man, MD;  Location: [facility] CV LAB;  Service: Cardiovascular;  Laterality: N/A;   TONSILLECTOMY      Prior to  Admission medications   Medication Sig Start Date End Date Taking? Authorizing Provider  albuterol (PROVENTIL HFA;VENTOLIN HFA) 108 (90 Base) MCG/ACT inhaler Inhale 2 puffs into the lungs every 4 (four) hours as needed for wheezing or shortness of breath.   Yes [provider]  albuterol (PROVENTIL) (2.5 MG/3ML) 0.083% nebulizer solution Take 2.5 mg by nebulization every 6 (six) hours as needed for wheezing or shortness of breath.   Yes [provider]  allopurinol (ZYLOPRIM) 100 MG tablet Take 100 mg by mouth daily.   Yes [provider]  baclofen (LIORESAL) 10 MG tablet Take 10 mg by mouth 4 (four) times daily as needed for muscle spasms.   Yes [provider]  cetirizine (ZYRTEC) 10 MG tablet Take 10 mg by mouth daily.   Yes [provider]  clopidogrel (PLAVIX) 75 MG tablet Take 75 mg by mouth daily.   Yes [provider]  diltiazem (CARDIZEM CD) 360 MG 24 hr capsule Take 1 capsule (360 mg total) by mouth daily. 05/02/18  Yes Bhagat, Bhavinkumar, PA  dronabinol (MARINOL) 2.5 MG capsule Take 2.5 mg by mouth 2 (two) times daily before lunch and supper.    Yes [provider]  ergocalciferol (VITAMIN D2) 1.25 MG (50000 UT) capsule Take 50,000 Units by mouth 2 (two) times a week. Monday and Thursday   Yes [provider]  Fluticasone-Umeclidin-Vilant (TRELEGY ELLIPTA) 100-62.5-25 MCG/INH AEPB Inhale 1 puff into the lungs daily.    Yes [provider]  Folic Acid (FOLATE PO) Take 800 mg by mouth daily.   Yes [provider]  furosemide (LASIX) 40 MG tablet Take 1 tablet (40 mg total) by mouth daily. 05/02/18  Yes Bhagat, Bhavinkumar, PA  gabapentin (NEURONTIN) 300 MG capsule Take 300 mg by mouth 4 (four) times daily.   Yes [provider]  hydrocortisone 2.5 % cream Apply 1 application topically 2 (two) times daily as needed (psoriasis).    Yes [provider]  insulin aspart (NOVOLOG) 100 UNIT/ML injection Inject 0-15 Units into the skin 3 (three) times daily before meals. Per sliding scale, 150-200 = 4 units 201-250 = 6 units 251-300 = 8 units 301-350 = 10 units 350> = 15 units   Yes [provider]  isosorbide mononitrate (IMDUR) 30 MG 24 hr tablet Take 30 mg by mouth daily.   Yes [provider]  levothyroxine (SYNTHROID, LEVOTHROID) 125 MCG tablet Take 125 mcg by mouth daily before breakfast.   Yes [provider]  linaclotide (LINZESS) 290 MCG CAPS capsule Take 290 mcg by mouth daily before breakfast.   Yes [provider]  LORazepam (ATIVAN) 1 MG tablet Take 1-2 mg by mouth every 4 (four) hours as needed for anxiety.    Yes  [provider]  losartan (COZAAR) 50 MG tablet Take 50 mg by mouth daily.   Yes [provider]  magnesium gluconate (MAGONATE) 500 MG tablet Take 500 mg by mouth daily.   Yes [provider]  methotrexate (RHEUMATREX) 2.5 MG tablet TAKE 3 TABLETS BY MOUTH ONCE A WEEK 08/15/18  Yes [provider]  metoCLOPramide (REGLAN) 10 MG tablet Take 10 mg by mouth 2 (two) times daily.   Yes [provider]  metolazone (ZAROXOLYN) 5 MG tablet Take 1 table 30 minutes prior to taking lasix on Monday, Wednesday and Friday 05/01/18  Yes Bhagat, Bhavinkumar, PA  montelukast (SINGULAIR) 10 MG tablet Take 10 mg by mouth at bedtime.   Yes [provider]  mycophenolate (CELLCEPT) 500 MG tablet Take 1,000 mg by mouth 2 (two) times daily.    Yes [provider]  nebivolol (BYSTOLIC) 10 MG tablet Take 10 mg by mouth daily.    Yes [provider]  nitroGLYCERIN (NITROSTAT) 0.4 MG SL tablet Place 0.4 mg under the tongue every 5 (five) minutes as needed for chest pain.   Yes [provider]  ondansetron (ZOFRAN) 8 MG tablet Take 8 mg by mouth every 8 (eight) hours as needed for nausea or vomiting.    Yes [provider]  oxyCODONE (OXY IR/ROXICODONE) 5 MG immediate release tablet Take 5 mg by mouth every 4 (four) hours as needed for severe pain.   Yes [provider]  Pancrelipase, Lip-Prot-Amyl, (ZENPEP) 10000-32000 units CPEP Take 10,000 Units by mouth 4 (four) times daily.   Yes [provider]  pantoprazole (PROTONIX) 40 MG tablet Take 40 mg by mouth 2 (two) times daily.    Yes [provider]  potassium chloride (K-DUR) 10 MEQ tablet TAKE 3 TABLETS BY MOUTH TWICE DAILY AND TAKE ADDITONAL 3 TABLETS ON DAY OF ZAROXYLENE(MONDAY, WEDNESDAY AND FRIDAY) 05/06/18  Yes Rollene RotundaHochrein, Kyrie Fludd, MD  predniSONE (DELTASONE) 10 MG tablet Take 12.5 mg by mouth daily with breakfast.    Yes [provider]  promethazine  (PHENERGAN) 12.5 MG tablet Take 12.5 mg by mouth every 6 (six) hours as needed for nausea or vomiting.   Yes [provider]  spironolactone (ALDACTONE) 25 MG tablet Take 1 tablet (25 mg total) by mouth daily. 05/02/18  Yes Bhagat, Bhavinkumar, PA  sulfaSALAzine (AZULFIDINE) 500 MG tablet Take 1,500 mg by mouth 2 (two) times daily.   Yes [provider]     Allergies:   Cymbalta [duloxetine hcl], Demerol [meperidine hcl], Penicillins, and Rocephin [ceftriaxone]   Social History   Tobacco Use   Smoking status: Former Smoker    Quit date: 04/27/2007    Years since quitting: 11.3   Smokeless tobacco: Never Used  Substance Use Topics   Alcohol use: Never    Frequency: Never   Drug use: Never     Family Hx: The patient's family history includes Diabetes Mellitus II in her mother and sister; Heart disease in her father and mother; Hyperlipidemia in her father and mother; Hypertension in her father and mother. There is no history of Clotting disorder or Irregular heart beat.  ROS:   Please see the history of present illness.    As stated in the HPI and negative for all other systems.   Prior CV studies:   The following studies were reviewed today:  PYP scan  Labs/Other Tests and Data Reviewed:    EKG:  No ECG reviewed.  Recent Labs: 05/01/2018: ALT 24; B Natriuretic Peptide 37.6 05/03/2018: BUN 25; Creatinine, Ser 1.05; Hemoglobin 13.6; Platelets 207; Potassium 4.1; Sodium 139   Recent Lipid Panel No results found for: CHOL, TRIG, HDL, CHOLHDL, LDLCALC, LDLDIRECT  Wt Readings from Last 3 Encounters:  09/10/18 296 lb (134.3 kg)  09/07/18 288 lb (130.6 kg)  05/21/18 288 lb 12.8 oz (131 kg)     Objective:    Vital Signs:  BP (!) 146/80    Pulse 100    Ht 5\' 3"  (1.6 m)    Wt 296 lb (134.3 kg)    BMI 52.43 kg/m    VITAL SIGNS:  reviewed GEN:  no acute distress EYES:  sclerae anicteric, EOMI - Extraocular Movements Intact NEURO:  alert and oriented  x 3, no  obvious focal deficit PSYCH:  normal affect  ASSESSMENT & PLAN:    CHEST PAIN:   There was no CAD. No further work up.   TACHYCARDIA:   She had her  Cardizem increased.   She had beta-blocker added and this therapy is appropriate.  No change.   ACUTE ON CHRONIC DIASTOLIC DYSFUNCTION:      She had an indeterminant PYP scan.  She does have some concern for possible infiltrative cardiomyopathy with her significantly elevated end-diastolic pressures and symptoms of diastolic dysfunction.  Echo has not been able to be good no images to really quantify LV wall thickness.   She does have low voltage on her EKGs.  I am going to order serum/urine IFE.   Also because she does not seem to be handling her volume well on current diuretics and when to stop her Lasix and start torsemide 20 mg daily.  She will need to increase her potassium.  Should take 90 mEq daily instead of the alternating dose that she is currently taking.  She is going to get a basic metabolic profile in 7 days.  Time:   Today, I have spent 25 minutes with the patient with telehealth technology discussing the above problems and reviewing records.     Medication Adjustments/Labs and Tests Ordered: Current medicines are reviewed at length with the patient today.  Concerns regarding medicines are outlined above.   Tests Ordered: No orders of the defined types were placed in this encounter.   Medication Changes: No orders of the defined types were placed in this encounter.   Follow Up:  Virtual Visit or In Person after the results of the studies are available.    Signed, Rollene RotundaJames Veronnica Hennings, MD  09/10/2018 1:11 PM    [facility]

## 2018-09-09 NOTE — Telephone Encounter (Signed)
Spoke with patient and scheduled for virtual visit tomorrow.  Patient appreciated appointment tomorrow.   Advised patient she will receive best possible care she can via video/telephone visit and her insurance will be billed. Patient gave verbal consent for visit. Did send consent via mychart as well.

## 2018-09-09 NOTE — Telephone Encounter (Signed)
I can do a virtual visit at 1 pm tomorrow.

## 2018-09-09 NOTE — Telephone Encounter (Signed)
Mychart, smartphone, consent, pre reg complete 09/09/18 AF °

## 2018-09-10 ENCOUNTER — Telehealth (INDEPENDENT_AMBULATORY_CARE_PROVIDER_SITE_OTHER): Payer: BC Managed Care – PPO | Admitting: Cardiology

## 2018-09-10 ENCOUNTER — Telehealth: Payer: BC Managed Care – PPO | Admitting: Cardiology

## 2018-09-10 ENCOUNTER — Encounter: Payer: Self-pay | Admitting: *Deleted

## 2018-09-10 ENCOUNTER — Encounter: Payer: Self-pay | Admitting: Cardiology

## 2018-09-10 VITALS — BP 146/80 | HR 100 | Ht 63.0 in | Wt 296.0 lb

## 2018-09-10 DIAGNOSIS — I5033 Acute on chronic diastolic (congestive) heart failure: Secondary | ICD-10-CM | POA: Diagnosis not present

## 2018-09-10 DIAGNOSIS — R000 Tachycardia, unspecified: Secondary | ICD-10-CM

## 2018-09-10 DIAGNOSIS — R0602 Shortness of breath: Secondary | ICD-10-CM

## 2018-09-10 MED ORDER — TORSEMIDE 20 MG PO TABS: 20.0000 mg | ORAL_TABLET | Freq: Every day | ORAL | 1 refills | Status: AC

## 2018-09-10 MED ORDER — POTASSIUM CHLORIDE ER 10 MEQ PO TBCR: EXTENDED_RELEASE_TABLET | ORAL | 1 refills | Status: DC

## 2018-09-10 NOTE — Patient Instructions (Addendum)
Medication Instructions:  STOP LASIX   START TORSEMIDE 20 MG DAILY   INCREASE POTASSIUM TO 90 MEQ DAILY   If you need a refill on your cardiac medications before your next appointment, please call your pharmacy.   Lab work: BMET/MYELOM PANEL/URINE IMMUNOFIXATION AT [facility] IN 1 WEEK  If you have labs (blood work) drawn today and your tests are completely normal, you will receive your results only by: Marland Kitchen MyChart Message (if you have MyChart) OR . A paper copy in the mail If you have any lab test that is abnormal or we need to change your treatment, we will call you to review the results.  Testing/Procedures: NONE  Follow-Up: KEEP 6/30 FOLLOW UP VISIT  IF YOUR LAB RESULTS ARE NOT BACK WILL MOVE THIS APPOINTMENT OUT

## 2018-09-13 ENCOUNTER — Other Ambulatory Visit: Payer: Self-pay | Admitting: Internal Medicine

## 2018-09-13 DIAGNOSIS — R9389 Abnormal findings on diagnostic imaging of other specified body structures: Secondary | ICD-10-CM

## 2018-09-14 ENCOUNTER — Other Ambulatory Visit: Payer: Self-pay

## 2018-09-14 MED ORDER — POTASSIUM CHLORIDE ER 10 MEQ PO TBCR: EXTENDED_RELEASE_TABLET | ORAL | 3 refills | Status: AC

## 2018-09-21 ENCOUNTER — Telehealth: Payer: BC Managed Care – PPO | Admitting: Cardiology

## 2018-09-22 NOTE — Progress Notes (Signed)
Virtual Visit via Video Note   This visit type was conducted due to national recommendations for restrictions regarding the COVID-19 Pandemic (e.g. social distancing) in an effort to limit this patient's exposure and mitigate transmission in our community.  Due to her co-morbid illnesses, this patient is at least at moderate risk for complications without adequate follow up.  This format is felt to be most appropriate for this patient at this time.  All issues noted in this document were discussed and addressed.  A limited physical exam was performed with this format.  Please refer to the patient's chart for her consent to telehealth for [facility].   Date:  09/23/2018   ID:  Elizabeth Higgins, DOB 16-Apr-1965, MRN [id number]  Patient Location: Home Provider Location: Home  PCP:  Doreen SalvageBulla, Donald, PA-C  Cardiologist:  Rollene RotundaJames Cayli Escajeda, MD  Electrophysiologist:  None   Evaluation Performed:  Follow-Up Visit  Chief Complaint:  Dyspnea ]  History of Present Illness:    Elizabeth Higgins is a 53 y.o. female who presents for followup of diastolic HF.  [facility].  She has a somewhat complicated past medical history with myositis [facility].  This is left her .  She had cath as below without obstructive disease.  Pressures are recorded.   She was found to have significant elevation of her EDP and wedge.  She was observed overnight for diuresis.  She had her Cardizem increased for rate control.   I ordered a PYP scan which was equivocal for amyloid.    At the last visit I ordered serum and urine IFE.  [facility] but [facility] did not inlcude the IFE.   I switched her to torsemide and she is done better with this.  She has been taken off the Zaroxolyn because her BUN and creatinine were elevated.  I did review labs today.  She denies any new shortness of breath, PND or orthopnea.  She has continued lower extremity swelling but she has less abdominal swelling  than she had before.  She overall feels a little bit better.  She is been able to walk 1000 steps more frequently than she was doing.  She is trying to be active.  The patient does not have symptoms concerning for COVID-19 infection (fever, chills, cough, or new shortness of breath).    Past Medical History:  Diagnosis Date   Autoimmune myopathy    Bruit    Dermatomycosis    Diabetes mellitus type 2, controlled (HCC)    DVT (deep vein thrombosis) in pregnancy    Hyperlipemia, mixed    Hypertension    Hypothyroidism    Intermittent cerebral ischemia    Migraines    WITH AURA   Osteoarthritis    Polymyositis (HCC)    PVC (premature ventricular contraction)    Reactive depression (situational)    Sleep apnea    CPAP with 02   Spinal stenosis at L4-L5 level    Vitamin D deficiency    Past Surgical History:  Procedure Laterality Date   ABDOMINAL HYSTERECTOMY     BILATERAL OOPHORECTOMY     BREAST REDUCTION SURGERY Bilateral    CESAREAN SECTION     CHOLECYSTECTOMY     GASTRECTOMY  2013   SLEEVE   LEFT HEART CATH     RIGHT/LEFT HEART CATH AND CORONARY ANGIOGRAPHY N/A 04/30/2018   Procedure: RIGHT/LEFT HEART CATH AND CORONARY ANGIOGRAPHY;  Surgeon: Marykay LexHarding, David W,  MD;  Location: [facility] INVASIVE CV LAB;  Service: Cardiovascular;  Laterality: N/A;   TONSILLECTOMY      Prior to Admission medications   Medication Sig Start Date End Date Taking? Authorizing Provider  albuterol (PROVENTIL HFA;VENTOLIN HFA) 108 (90 Base) MCG/ACT inhaler Inhale 2 puffs into the lungs every 4 (four) hours as needed for wheezing or shortness of breath.   Yes [provider]  albuterol (PROVENTIL) (2.5 MG/3ML) 0.083% nebulizer solution Take 2.5 mg by nebulization every 6 (six) hours as needed for wheezing or shortness of breath.   Yes [provider]  allopurinol (ZYLOPRIM) 100 MG tablet Take 100 mg by mouth daily.   Yes [provider]  baclofen (LIORESAL)  10 MG tablet Take 10 mg by mouth 4 (four) times daily as needed for muscle spasms.   Yes [provider]  cetirizine (ZYRTEC) 10 MG tablet Take 10 mg by mouth daily.   Yes [provider]  clopidogrel (PLAVIX) 75 MG tablet Take 75 mg by mouth daily.   Yes [provider]  diltiazem (CARDIZEM CD) 360 MG 24 hr capsule Take 1 capsule (360 mg total) by mouth daily. 05/02/18  Yes Bhagat, Bhavinkumar, PA  dronabinol (MARINOL) 2.5 MG capsule Take 2.5 mg by mouth 2 (two) times daily before lunch and supper.    Yes [provider]  ergocalciferol (VITAMIN D2) 1.25 MG (50000 UT) capsule Take 50,000 Units by mouth 2 (two) times a week. Monday and Thursday   Yes [provider]  Fluticasone-Umeclidin-Vilant (TRELEGY ELLIPTA) 100-62.5-25 MCG/INH AEPB Inhale 1 puff into the lungs daily.    Yes [provider]  Folic Acid (FOLATE PO) Take 800 mg by mouth daily.   Yes [provider]  furosemide (LASIX) 40 MG tablet Take 1 tablet (40 mg total) by mouth daily. 05/02/18  Yes Bhagat, Bhavinkumar, PA  gabapentin (NEURONTIN) 300 MG capsule Take 300 mg by mouth 4 (four) times daily.   Yes [provider]  hydrocortisone 2.5 % cream Apply 1 application topically 2 (two) times daily as needed (psoriasis).    Yes [provider]  insulin aspart (NOVOLOG) 100 UNIT/ML injection Inject 0-15 Units into the skin 3 (three) times daily before meals. Per sliding scale, 150-200 = 4 units 201-250 = 6 units 251-300 = 8 units 301-350 = 10 units 350> = 15 units   Yes [provider]  isosorbide mononitrate (IMDUR) 30 MG 24 hr tablet Take 30 mg by mouth daily.   Yes [provider]  levothyroxine (SYNTHROID, LEVOTHROID) 125 MCG tablet Take 125 mcg by mouth daily before breakfast.   Yes [provider]  linaclotide (LINZESS) 290 MCG CAPS capsule Take 290 mcg by mouth daily before breakfast.   Yes [provider]  LORazepam  (ATIVAN) 1 MG tablet Take 1-2 mg by mouth every 4 (four) hours as needed for anxiety.    Yes [provider]  losartan (COZAAR) 50 MG tablet Take 50 mg by mouth daily.   Yes [provider]  magnesium gluconate (MAGONATE) 500 MG tablet Take 500 mg by mouth daily.   Yes [provider]  methotrexate (RHEUMATREX) 2.5 MG tablet TAKE 3 TABLETS BY MOUTH ONCE A WEEK 08/15/18  Yes [provider]  metoCLOPramide (REGLAN) 10 MG tablet Take 10 mg by mouth 2 (two) times daily.   Yes [provider]  metolazone (ZAROXOLYN) 5 MG tablet Take 1 table 30 minutes prior to taking lasix on Monday, Wednesday and Friday 05/01/18  Yes Bhagat, Bhavinkumar, PA  montelukast (SINGULAIR) 10 MG tablet Take 10 mg by mouth at bedtime.   Yes [provider]  mycophenolate (CELLCEPT) 500 MG tablet Take 1,000 mg by mouth 2 (two) times daily.    Yes [provider]  nebivolol (BYSTOLIC) 10 MG tablet Take 10 mg by mouth daily.    Yes [provider]  nitroGLYCERIN (NITROSTAT) 0.4 MG SL tablet Place 0.4 mg under the tongue every 5 (five) minutes as needed for chest pain.   Yes [provider]  ondansetron (ZOFRAN) 8 MG tablet Take 8 mg by mouth every 8 (eight) hours as needed for nausea or vomiting.    Yes [provider]  oxyCODONE (OXY IR/ROXICODONE) 5 MG immediate release tablet Take 5 mg by mouth every 4 (four) hours as needed for severe pain.   Yes [provider]  Pancrelipase, Lip-Prot-Amyl, (ZENPEP) 10000-32000 units CPEP Take 10,000 Units by mouth 4 (four) times daily.   Yes [provider]  pantoprazole (PROTONIX) 40 MG tablet Take 40 mg by mouth 2 (two) times daily.    Yes [provider]  potassium chloride (K-DUR) 10 MEQ tablet TAKE 3 TABLETS BY MOUTH TWICE DAILY AND TAKE ADDITONAL 3 TABLETS ON DAY OF ZAROXYLENE(MONDAY, WEDNESDAY AND FRIDAY) 05/06/18  Yes Minus Breeding, MD  predniSONE (DELTASONE) 10 MG tablet  Take 12.5 mg by mouth daily with breakfast.    Yes [provider]  promethazine (PHENERGAN) 12.5 MG tablet Take 12.5 mg by mouth every 6 (six) hours as needed for nausea or vomiting.   Yes [provider]  spironolactone (ALDACTONE) 25 MG tablet Take 1 tablet (25 mg total) by mouth daily. 05/02/18  Yes Bhagat, Bhavinkumar, PA  sulfaSALAzine (AZULFIDINE) 500 MG tablet Take 1,500 mg by mouth 2 (two) times daily.   Yes [provider]     Allergies:   Cymbalta [duloxetine hcl], Demerol [meperidine hcl], Penicillins, and Rocephin [ceftriaxone]   Social History   Tobacco Use   Smoking status: Former Smoker    Quit date: 04/27/2007    Years since quitting: 11.4   Smokeless tobacco: Never Used  Substance Use Topics   Alcohol use: Never    Frequency: Never   Drug use: Never     Family Hx: The patient's family history includes Diabetes Mellitus II in her mother and sister; Heart disease in her father and mother; Hyperlipidemia in her father and mother; Hypertension in her father and mother. There is no history of Clotting disorder or Irregular heart beat.  ROS:   Please see the history of present illness.    As stated in the HPI and negative for all other systems.   Prior CV studies:   The following studies were reviewed today:  Labs  Labs/Other Tests and Data Reviewed:    EKG:  No ECG reviewed.  Recent Labs: 05/01/2018: ALT 24; B Natriuretic Peptide 37.6 05/03/2018: BUN 25; Creatinine, Ser 1.05; Hemoglobin 13.6; Platelets 207; Potassium 4.1; Sodium 139   Recent Lipid Panel No results found for: CHOL, TRIG, HDL, CHOLHDL, LDLCALC, LDLDIRECT  Wt Readings from Last 3 Encounters:  09/23/18 298 lb (135.2 kg)  09/10/18 296 lb (134.3 kg)  09/07/18 288 lb (130.6 kg)     Objective:    Vital Signs:  BP 132/80    Pulse 84    Ht 5\' 3"  (1.6 m)    Wt 298 lb (135.2 kg)    BMI 52.79 kg/m    VITAL SIGNS:  reviewed GEN:  No distress NEURO:  Nonfocal PSYCH:   Appropariate  ASSESSMENT & PLAN:    CHEST PAIN:    She has no new chest pain.  No further testing is indicated.    TACHYCARDIA:   She is not having any new symptoms related to this.  She will continue with Cardizem.    ACUTE ON CHRONIC DIASTOLIC DYSFUNCTION:    We talked at length about wrapping her legs and she is getting get her daughter to do this.  She can keep them elevated.  She will remain on the dose of torsemide as listed and I will repeat labs to get the IFE .    PREOP CATARACT: She is going to have this done and she would have no contraindication from a cardiac standpoint.  No further testing is indicated.  Time:   Today, I have spent 25 minutes with the patient with telehealth technology discussing the above problems and reviewing records.     Medication Adjustments/Labs and Tests Ordered: Current medicines are reviewed at length with the patient today.  Concerns regarding medicines are outlined above.   Tests Ordered: No orders of the defined types were placed in this encounter.   Medication Changes: No orders of the defined types were placed in this encounter.   Follow Up:  Virtual Visit or In Person after the results of the studies are available.    Signed, Rollene RotundaJames Toshiye Kever, MD  09/23/2018 3:47 PM    [facility]

## 2018-09-23 ENCOUNTER — Telehealth (INDEPENDENT_AMBULATORY_CARE_PROVIDER_SITE_OTHER): Payer: BC Managed Care – PPO | Admitting: Cardiology

## 2018-09-23 ENCOUNTER — Encounter: Payer: Self-pay | Admitting: Cardiology

## 2018-09-23 VITALS — BP 132/80 | HR 84 | Ht 63.0 in | Wt 298.0 lb

## 2018-09-23 DIAGNOSIS — R000 Tachycardia, unspecified: Secondary | ICD-10-CM

## 2018-09-23 DIAGNOSIS — R0602 Shortness of breath: Secondary | ICD-10-CM | POA: Diagnosis not present

## 2018-09-23 DIAGNOSIS — I5033 Acute on chronic diastolic (congestive) heart failure: Secondary | ICD-10-CM | POA: Diagnosis not present

## 2018-09-23 NOTE — Patient Instructions (Signed)
Medication Instructions:  Your physician recommends that you continue on your current medications as directed. Please refer to the Current Medication list given to you today.  If you need a refill on your cardiac medications before your next appointment, please call your pharmacy.   Lab work: Multiple Tax inspector at the office   If you have labs (blood work) drawn today and your tests are completely normal, you will receive your results only by: Marland Kitchen MyChart Message (if you have MyChart) OR . A paper copy in the mail If you have any lab test that is abnormal or we need to change your treatment, we will call you to review the results.  Testing/Procedures: NONE   Follow-Up: At [facility], you and your health needs are our priority.  As part of our continuing mission to provide you with exceptional heart care, we have created designated Provider Care Teams.  These Care Teams include your primary Cardiologist (physician) and Advanced Practice Providers (APPs -  Physician Assistants and Nurse Practitioners) who all work together to provide you with the care you need, when you need it. You will need a follow up appointment in 3 months. IN THE OFFICE  Please call our office 2 months in advance to schedule this appointment.  You may see Minus Breeding, MD or one of the following Advanced Practice Providers on your designated Care Team:   Rosaria Ferries, PA-C . Jory Sims, DNP, ANP

## 2018-09-27 ENCOUNTER — Telehealth: Payer: Self-pay

## 2018-09-27 NOTE — Telephone Encounter (Signed)
Incoming call from [place] in [place]. Pt here for labs today and states she had immunofixation, urine done at her PCP office on 7/2, but still needs Multiple Myeloma Panel (SPEP&IFE w/QIG). Teodoro Kil to collect Multiple Myeloma Panel (SPEP&IFE w/QIG) and will route message to [facility] to make aware

## 2018-09-28 LAB — MULTIPLE MYELOMA PANEL, SERUM
Albumin SerPl Elph-Mcnc: 3.6 g/dL (ref 2.9–4.4)
Albumin/Glob SerPl: 1.4 (ref 0.7–1.7)
Alpha 1: 0.2 g/dL (ref 0.0–0.4)
Alpha2 Glob SerPl Elph-Mcnc: 0.9 g/dL (ref 0.4–1.0)
B-Globulin SerPl Elph-Mcnc: 1.1 g/dL (ref 0.7–1.3)
Gamma Glob SerPl Elph-Mcnc: 0.4 g/dL (ref 0.4–1.8)
Globulin, Total: 2.6 g/dL (ref 2.2–3.9)
IgA/Immunoglobulin A, Serum: 51 mg/dL — ABNORMAL LOW (ref 87–352)
IgG (Immunoglobin G), Serum: 520 mg/dL — ABNORMAL LOW (ref 586–1602)
IgM (Immunoglobulin M), Srm: 47 mg/dL (ref 26–217)
Total Protein: 6.2 g/dL (ref 6.0–8.5)

## 2018-09-29 ENCOUNTER — Ambulatory Visit
Admission: RE | Admit: 2018-09-29 | Discharge: 2018-09-29 | Disposition: A | Discharge status: Home / Self Care | Payer: BC Managed Care – PPO | Source: Ambulatory Visit | Attending: Internal Medicine | Admitting: Internal Medicine

## 2018-09-29 DIAGNOSIS — R9389 Abnormal findings on diagnostic imaging of other specified body structures: Secondary | ICD-10-CM

## 2018-09-30 ENCOUNTER — Telehealth: Payer: Self-pay | Admitting: Cardiology

## 2018-09-30 NOTE — Telephone Encounter (Signed)
Per secure chat from Dr Enrigue Catena, This lady is starting to send frequent patient advice request messages and expecting that I will answer in real time. Please explain that that is not how this office uses that forum. I sent a telephone note explaining how I would answer that question. I do not always check these messages as they are not to be used for urgent or same day communication.

## 2018-09-30 NOTE — Telephone Encounter (Signed)
Patient has sent patient advice request messages.  Per our standard work we suggest to patients not to leave questions that require a same day answer.  If she has an immediate questions she should call our triage nurse.  Text of her message is below.    O2 ran 92-94 HEart rate resting in low 90s   Exertion 120s. You saw my labs which are not great. Lost 6 lbs of fluid per scale. My question is even the high beta blocker, is is possible to go into a weird rhythm? Also note That this am I have awakened to few fine with none of what I just described   Your input is valued  Kind regards    The answer is yes she could have arrhythmias despite beta blockers.  She would need to transmit a rhythm strip when she is out of rhythm in order for this rhythm to be evaluated.    Again, please advise that patient advice request is for non urgent questions.

## 2018-09-30 NOTE — Telephone Encounter (Signed)
As requested by [facility] I spoke with Pt and explained the turn around time and urgency of emails and pt states that in the future she will call with any urgent issues. She states that she is currently in her PCP's office to address her arhythmia's and she states that she will send a f/u email providing any treatments that he may suggest just for an FYI.

## 2018-09-30 NOTE — Telephone Encounter (Signed)
Per 09/27/2018 mychart message:  Waylan Rocher, LPN routed this conversation to Minus Breeding, MD  Ruby Cola to Minus Breeding, MD     09/27/18 8:37 AM Dr Gilman Buttner followed your instructions on wrapping the legs. It has helped but this weekend was a struggle. My heart pounded and it was difficult to breath with minor wheezing and stridor. Very shob with exertion. O2 ran 92-94 HEart rate resting in low 90s  Exertion 120s. You saw my labs which are not great. Lost 6 lbs of fluid per scale. My question is even the high beta blocker, is is possible to go into a weird rhythm? Also note That this am I have awakened to few fine with none of what I just described  Your input is valued Kind regards  Aissa Lisowski ______  There is an in-office visit with MD open on 10/05/18 @ 2:20pm if patient needs in person evaluation

## 2018-09-30 NOTE — Telephone Encounter (Signed)
I sent a message to concerning this patient with the answer to this question

## 2018-10-01 NOTE — Telephone Encounter (Signed)
Addressed in another encounter

## 2018-10-28 ENCOUNTER — Encounter: Payer: Self-pay | Admitting: *Deleted

## 2018-12-24 ENCOUNTER — Ambulatory Visit: Payer: BC Managed Care – PPO | Admitting: Cardiology

## 2021-08-12 ENCOUNTER — Other Ambulatory Visit (HOSPITAL_COMMUNITY): Payer: Self-pay | Admitting: *Deleted

## 2021-08-12 DIAGNOSIS — I25110 Atherosclerotic heart disease of native coronary artery with unstable angina pectoris: Secondary | ICD-10-CM

## 2021-08-16 ENCOUNTER — Ambulatory Visit (HOSPITAL_BASED_OUTPATIENT_CLINIC_OR_DEPARTMENT_OTHER)
Admission: RE | Admit: 2021-08-16 | Discharge: 2021-08-16 | Disposition: A | Discharge status: Home / Self Care | Payer: Self-pay | Source: Ambulatory Visit | Attending: Internal Medicine | Admitting: Internal Medicine

## 2021-08-16 DIAGNOSIS — I25110 Atherosclerotic heart disease of native coronary artery with unstable angina pectoris: Secondary | ICD-10-CM

## 2021-08-20 ENCOUNTER — Other Ambulatory Visit (HOSPITAL_COMMUNITY): Payer: Self-pay | Admitting: Internal Medicine

## 2021-08-20 ENCOUNTER — Encounter (HOSPITAL_COMMUNITY): Payer: Self-pay

## 2021-08-20 ENCOUNTER — Other Ambulatory Visit (HOSPITAL_COMMUNITY): Payer: Self-pay | Admitting: *Deleted

## 2021-08-20 DIAGNOSIS — I2510 Atherosclerotic heart disease of native coronary artery without angina pectoris: Secondary | ICD-10-CM

## 2021-08-20 MED ORDER — METOPROLOL TARTRATE 100 MG PO TABS: ORAL_TABLET | ORAL | 0 refills | Status: AC

## 2021-08-22 ENCOUNTER — Other Ambulatory Visit (HOSPITAL_COMMUNITY): Payer: Self-pay

## 2021-09-06 ENCOUNTER — Telehealth (HOSPITAL_COMMUNITY): Payer: Self-pay | Admitting: *Deleted

## 2021-09-06 NOTE — Telephone Encounter (Signed)
Patient returning call regarding upcoming cardiac imaging study; pt verbalizes understanding of appt date/time, parking situation and where to check in, pre-test NPO status and medications ordered, and verified current allergies; name and call back number provided for further questions should they arise  Larey Brick RN Navigator Cardiac Imaging [facility] [phone] office [phone] cell  Patient to take 100mg  metoprolol tartrate TWO hours prior to her cardiac CT scan.  She is aware to arrive at 11:30am.

## 2021-09-09 ENCOUNTER — Other Ambulatory Visit: Payer: Self-pay | Admitting: Internal Medicine

## 2021-09-09 ENCOUNTER — Ambulatory Visit (HOSPITAL_COMMUNITY)
Admission: RE | Admit: 2021-09-09 | Discharge: 2021-09-09 | Disposition: A | Discharge status: Home / Self Care | Payer: Medicare Other | Source: Ambulatory Visit | Attending: Internal Medicine | Admitting: Internal Medicine

## 2021-09-09 DIAGNOSIS — I2510 Atherosclerotic heart disease of native coronary artery without angina pectoris: Secondary | ICD-10-CM | POA: Diagnosis present

## 2021-09-09 DIAGNOSIS — R931 Abnormal findings on diagnostic imaging of heart and coronary circulation: Secondary | ICD-10-CM

## 2021-09-09 MED ORDER — IOHEXOL 350 MG/ML SOLN
100.0000 mL | Freq: Once | INTRAVENOUS | Status: AC | PRN
  Administered 2021-09-09: 100 mL via INTRAVENOUS

## 2021-09-09 MED ORDER — NITROGLYCERIN 0.4 MG SL SUBL
SUBLINGUAL_TABLET | SUBLINGUAL | Status: AC
  Filled 2021-09-09: qty 2

## 2021-09-09 MED ORDER — NITROGLYCERIN 0.4 MG SL SUBL
0.8000 mg | SUBLINGUAL_TABLET | Freq: Once | SUBLINGUAL | Status: AC
  Administered 2021-09-09: 0.8 mg via SUBLINGUAL

## 2021-09-09 NOTE — Progress Notes (Signed)
Please send CT for FFR - Dr. Jaquelynn Wanamaker 

## 2022-10-10 IMAGING — CT CT HEART MORP W/ CTA COR W/ SCORE W/ CA W/CM &/OR W/O CM
4 of 7 series · 8 of 20 positions shown, 9 images · IV contrast (APPLIED)
Comparison: None.

Addendum:
HISTORY: 56 yo female with chest pain, high CAC score

EXAM:
Cardiac/Coronary CTA
TECHNIQUE: The patient was scanned on a Siemens Force scanner.
PROTOCOL: A 100 kV prospective scan was triggered in the descending thoracic
aorta at 111 HU's. Axial non-contrast 3 mm slices were carried out
through the heart. The data set was analyzed on a dedicated work
station and scored using the Agatson method. Gantry rotation speed
was 250 msecs and collimation was .6 mm. Beta blockade and 0.8 mg of
sl NTG was given. The 3D data set was reconstructed in 5% intervals
of the 35-75 % of the R-R cycle. Diastolic phases were analyzed on a
dedicated work station using MPR, MIP and VRT modes. The patient
received 100mL OMNIPAQUE IOHEXOL 350 MG/ML SOLN of contrast.

[Series 7: best diast 79 % · axial · 0.39mm/px · z∈[-148,-114]mm · 2 of 263 slices shown]
[im 88/263  vessel]
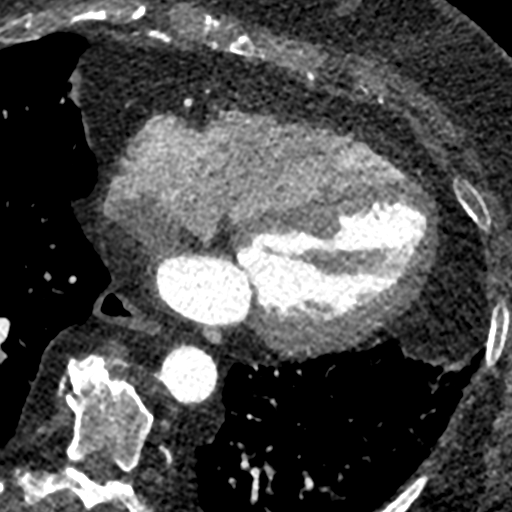
[im 175/263  vessel]
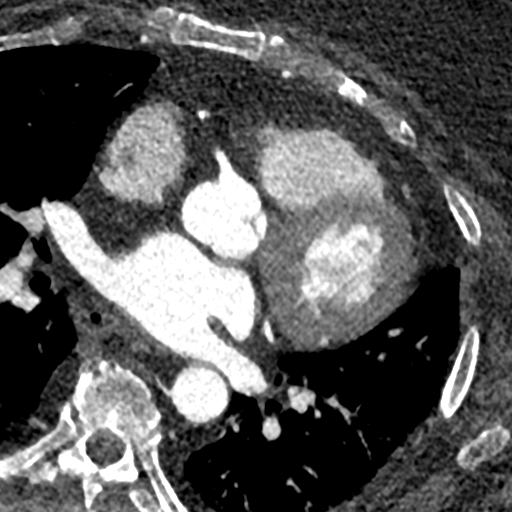

[Series 8: best syst · axial · 0.39mm/px · z∈[-148,-114]mm · 2 of 263 slices shown, 3 images]
[im 88/263  vessel]
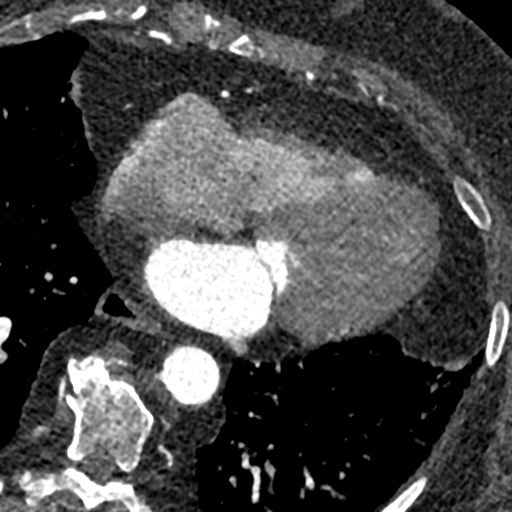
[im 88/263  lung]
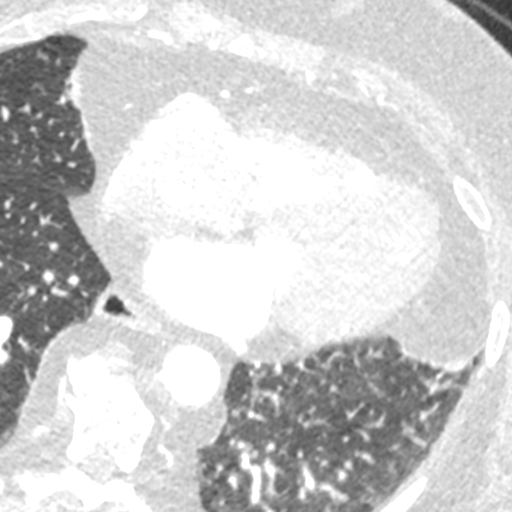
[im 175/263  vessel]
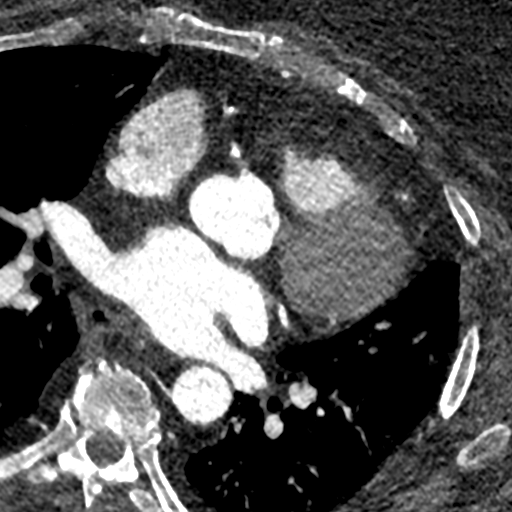

[Series 9: ts diast sharp 79 % · axial · 0.39mm/px · z∈[-148,-114]mm · 2 of 263 slices shown]
[im 88/263  lung]
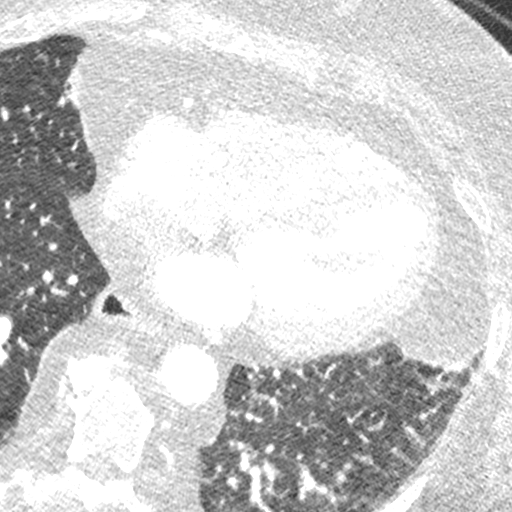
[im 175/263  lung]
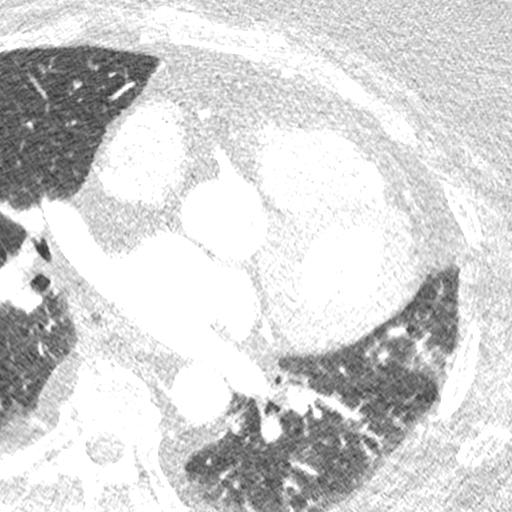

[Series 10: ts syst sharp · axial · 0.39mm/px · z∈[-148,-114]mm · 2 of 263 slices shown]
[im 88/263  lung]
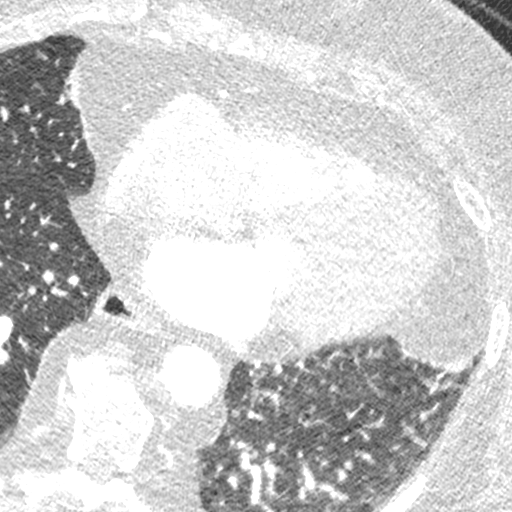
[im 175/263  lung]
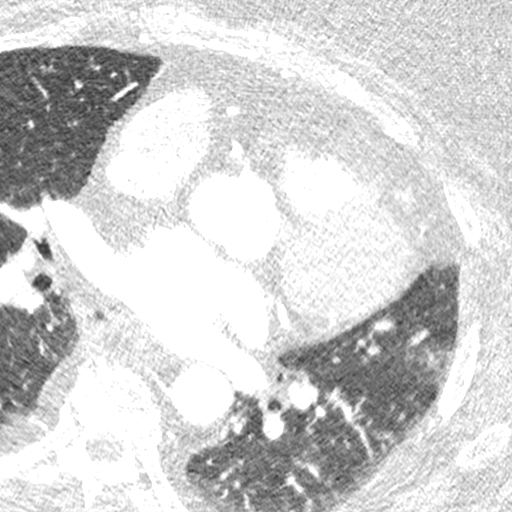

[8 of 20 positions shown; findings below may reference images not displayed]

FINDINGS: Quality: Poor, attenuation artifact, motion, HR 65

Coronary calcium score: The patient's coronary artery calcium score
is 600, which places the patient in the 99th percentile.

Coronary arteries: Normal coronary origins.  Right dominance.

Right Coronary Artery: Dominant. Diffusely calcified with moderate
to possibly severe proximal to mid-vessel stenosis (50-69%,
E9CJ9CAQ).

Left Main Coronary Artery: Normal. Bifurcates into the LAD and LCx
arteries.

Left Anterior Descending Coronary Artery: Anterior artery that wraps
around the apex. There is a moderate mixed eccentric proximal
stenosis ([id number], 50-69%). D1 branch with minimal mixed proximal
stenosis.

Left Circumflex Artery: AV groove vessel, no significant disease.

Aorta: Normal size, 30 mm at the mid ascending aorta (level of the
PA bifurcation) measured double oblique. Aortic atherosclerosis. No
dissection.

Aortic Valve: Trileaflet. No calcifications.

Other findings:

Normal pulmonary vein drainage into the left atrium.

Normal left atrial appendage without a thrombus.

Normal size of the pulmonary artery.

Aneurysmal interatrial septum, cannot exclude PFO
IMPRESSION: 1. Moderate to severe mixed CAD, CADRADS = 3. CT FFR will be
performed and reported separately.

2. Coronary calcium score of 600. This was 99th percentile for age
and sex matched control.

3. Normal coronary origin with right dominance.

4. Aneurysmal interatrial septum, cannot exclude PFO

5. Aortic atherosclerosis.

EXAM:
OVER-READ INTERPRETATION  CT CHEST

The following report is an over-read performed by radiologist Dr.
Rosalee Jim [[email or web address]] on 09/09/2021. This over-read
does not include interpretation of cardiac or coronary anatomy or
pathology. The coronary CTA interpretation by the cardiologist is
attached.
FINDINGS: No pericardial effusion.

Aorta normal caliber.

No thoracic adenopathy.

Visualized esophagus normal appearance.

Minimal atelectasis at base of lingula.

Remaining visualized lungs clear.

Visualized liver and spleen unremarkable.

Nonspecific dense calcifications in both breasts again identified.

No osseous or chest wall abnormalities.
IMPRESSION: No significant abnormalities.

*** End of Addendum ***
FINDINGS: Quality: Poor, attenuation artifact, motion, HR 65

Coronary calcium score: The patient's coronary artery calcium score
is 600, which places the patient in the 99th percentile.

Coronary arteries: Normal coronary origins.  Right dominance.

Right Coronary Artery: Dominant. Diffusely calcified with moderate
to possibly severe proximal to mid-vessel stenosis (50-69%,
E9CJ9CAQ).

Left Main Coronary Artery: Normal. Bifurcates into the LAD and LCx
arteries.

Left Anterior Descending Coronary Artery: Anterior artery that wraps
around the apex. There is a moderate mixed eccentric proximal
stenosis ([id number], 50-69%). D1 branch with minimal mixed proximal
stenosis.

Left Circumflex Artery: AV groove vessel, no significant disease.

Aorta: Normal size, 30 mm at the mid ascending aorta (level of the
PA bifurcation) measured double oblique. Aortic atherosclerosis. No
dissection.

Aortic Valve: Trileaflet. No calcifications.

Other findings:

Normal pulmonary vein drainage into the left atrium.

Normal left atrial appendage without a thrombus.

Normal size of the pulmonary artery.

Aneurysmal interatrial septum, cannot exclude PFO
IMPRESSION: 1. Moderate to severe mixed CAD, CADRADS = 3. CT FFR will be
performed and reported separately.

2. Coronary calcium score of 600. This was 99th percentile for age
and sex matched control.

3. Normal coronary origin with right dominance.

4. Aneurysmal interatrial septum, cannot exclude PFO

5. Aortic atherosclerosis.
